# Patient Record
Sex: Female | Born: 1970 | Race: Black or African American | Hispanic: No | Smoking: Never smoker
Health system: Southern US, Community
[De-identification: ages and names within clinical notes are randomized; demographics above are authoritative.]

## PROBLEM LIST (undated history)

## (undated) DIAGNOSIS — R011 Cardiac murmur, unspecified: Secondary | ICD-10-CM

## (undated) DIAGNOSIS — R079 Chest pain, unspecified: Secondary | ICD-10-CM

## (undated) DIAGNOSIS — D649 Anemia, unspecified: Secondary | ICD-10-CM

## (undated) DIAGNOSIS — D869 Sarcoidosis, unspecified: Secondary | ICD-10-CM

## (undated) DIAGNOSIS — I10 Essential (primary) hypertension: Secondary | ICD-10-CM

## (undated) DIAGNOSIS — E010 Iodine-deficiency related diffuse (endemic) goiter: Secondary | ICD-10-CM

## (undated) DIAGNOSIS — K219 Gastro-esophageal reflux disease without esophagitis: Secondary | ICD-10-CM

## (undated) DIAGNOSIS — J45909 Unspecified asthma, uncomplicated: Secondary | ICD-10-CM

## (undated) HISTORY — DX: Anemia, unspecified: D64.9

## (undated) HISTORY — DX: Chest pain, unspecified: R07.9

## (undated) HISTORY — DX: Essential (primary) hypertension: I10

## (undated) HISTORY — DX: Cardiac murmur, unspecified: R01.1

## (undated) HISTORY — PX: HERNIA REPAIR: SHX51

## (undated) HISTORY — PX: BUNIONECTOMY: SHX129

## (undated) HISTORY — DX: Sarcoidosis, unspecified: D86.9

## (undated) HISTORY — DX: Iodine-deficiency related diffuse (endemic) goiter: E01.0

## (undated) HISTORY — DX: Gastro-esophageal reflux disease without esophagitis: K21.9

## (undated) HISTORY — DX: Unspecified asthma, uncomplicated: J45.909

## (undated) HISTORY — PX: BREAST BIOPSY: SHX20

---

## 1999-12-01 ENCOUNTER — Emergency Department (HOSPITAL_COMMUNITY): Admission: EM | Admit: 1999-12-01 | Discharge: 1999-12-01 | Payer: Self-pay | Admitting: Emergency Medicine

## 1999-12-01 ENCOUNTER — Encounter: Payer: Self-pay | Admitting: Emergency Medicine

## 2007-11-25 ENCOUNTER — Emergency Department (HOSPITAL_COMMUNITY): Admission: EM | Admit: 2007-11-25 | Discharge: 2007-11-25 | Payer: Self-pay | Admitting: *Deleted

## 2011-06-02 DIAGNOSIS — R079 Chest pain, unspecified: Secondary | ICD-10-CM | POA: Diagnosis not present

## 2011-06-02 DIAGNOSIS — J42 Unspecified chronic bronchitis: Secondary | ICD-10-CM | POA: Diagnosis not present

## 2011-06-02 DIAGNOSIS — K5900 Constipation, unspecified: Secondary | ICD-10-CM | POA: Diagnosis not present

## 2011-06-02 DIAGNOSIS — J209 Acute bronchitis, unspecified: Secondary | ICD-10-CM | POA: Diagnosis not present

## 2011-06-02 DIAGNOSIS — R109 Unspecified abdominal pain: Secondary | ICD-10-CM | POA: Diagnosis not present

## 2011-06-03 ENCOUNTER — Encounter: Payer: Self-pay | Admitting: Emergency Medicine

## 2011-06-03 ENCOUNTER — Emergency Department (HOSPITAL_COMMUNITY)
Admission: EM | Admit: 2011-06-03 | Discharge: 2011-06-04 | Disposition: A | Discharge status: Home / Self Care | Payer: Medicare Other | Attending: Emergency Medicine | Admitting: Emergency Medicine

## 2011-06-03 ENCOUNTER — Emergency Department (HOSPITAL_COMMUNITY): Payer: Medicare Other

## 2011-06-03 DIAGNOSIS — R079 Chest pain, unspecified: Secondary | ICD-10-CM | POA: Insufficient documentation

## 2011-06-03 DIAGNOSIS — M546 Pain in thoracic spine: Secondary | ICD-10-CM | POA: Insufficient documentation

## 2011-06-03 DIAGNOSIS — R110 Nausea: Secondary | ICD-10-CM | POA: Insufficient documentation

## 2011-06-03 DIAGNOSIS — J40 Bronchitis, not specified as acute or chronic: Secondary | ICD-10-CM | POA: Diagnosis not present

## 2011-06-03 DIAGNOSIS — R51 Headache: Secondary | ICD-10-CM | POA: Diagnosis not present

## 2011-06-03 DIAGNOSIS — R42 Dizziness and giddiness: Secondary | ICD-10-CM | POA: Insufficient documentation

## 2011-06-03 DIAGNOSIS — J42 Unspecified chronic bronchitis: Secondary | ICD-10-CM | POA: Insufficient documentation

## 2011-06-03 DIAGNOSIS — J209 Acute bronchitis, unspecified: Secondary | ICD-10-CM | POA: Diagnosis not present

## 2011-06-03 LAB — PREGNANCY, URINE: Preg Test, Ur: NEGATIVE

## 2011-06-03 MED ORDER — ONDANSETRON 8 MG PO TBDP: 8.0000 mg | ORAL_TABLET | Freq: Three times a day (TID) | ORAL | Status: DC | PRN

## 2011-06-03 MED ORDER — ONDANSETRON 8 MG PO TBDP: 8.0000 mg | ORAL_TABLET | Freq: Three times a day (TID) | ORAL | Status: AC | PRN

## 2011-06-03 MED ORDER — AZITHROMYCIN 250 MG PO TABS: 250.0000 mg | ORAL_TABLET | Freq: Every day | ORAL | Status: DC

## 2011-06-03 NOTE — ED Notes (Signed)
Pt sts HA with dizziness and nausea and mid back pain x 3 days; pt sts mid sternal CP starting yesterday

## 2011-06-03 NOTE — ED Provider Notes (Signed)
History     CSN: [id number]  Arrival date & time 06/03/11  1827   First MD Initiated Contact with Patient 06/03/11 2126      Chief Complaint  Patient presents with  . Chest Pain  . Headache  . Dizziness  . Nausea    (Consider location/radiation/quality/duration/timing/severity/associated sxs/prior treatment) HPI History provided by pt.   Pt has had constant nausea w/out vomiting, diffuse upper back pain and intermittent frontal headache and dizziness x 6 days.  Has had similar headaches in the past.  Has had chills but denies fever as well as cough, nasal congestion, sore throat, abd pain, diarrhea, urinary sx.  Is currently on menstrual cycle and cycles have been regular.  Yesterday she developed intermittent, non-exertional, non-radiating tightness and sharp pains in chest.  No associated SOB.  No immediate FH MI, does not smoke cigarettes and no PMH.  Pt evaluated for same symptoms at [facility] ED yesterday.   She reports that CXR and labs were normal.  She does not believe that they evaluated her heart though and would like to be checked for heart attack.    Past Medical History  Diagnosis Date  . Chronic bronchitis     History reviewed. No pertinent past surgical history.  History reviewed. No pertinent family history.  History  Substance Use Topics  . Smoking status: Never Smoker   . Smokeless tobacco: Not on file  . Alcohol Use: No    OB History    Grav Para Term Preterm Abortions TAB SAB Ect Mult Living                  Review of Systems  All other systems reviewed and are negative.    Allergies  Motrin  Home Medications   Current Outpatient Rx  Name Route Sig Dispense Refill  . ASPIRIN EC 81 MG PO TBEC Oral Take 81 mg by mouth 3 (three) times a week.      Marland Kitchen TIOTROPIUM BROMIDE MONOHYDRATE 18 MCG IN CAPS Inhalation Place 18 mcg into inhaler and inhale daily.        BP 135/93  Pulse 70  Temp(Src) 97.8 F (36.6 C) (Oral)  Resp 15  SpO2  100%  Physical Exam  Nursing note and vitals reviewed. Constitutional: She is oriented to person, place, and time. She appears well-developed and well-nourished. No distress.  HENT:  Head: Normocephalic and atraumatic.  Eyes:       Normal appearance  Neck: Normal range of motion.  Cardiovascular: Normal rate and regular rhythm.   Pulmonary/Chest: Effort normal and breath sounds normal. No respiratory distress. She exhibits no tenderness.  Abdominal: Soft. Bowel sounds are normal. She exhibits no distension. There is no tenderness. There is no guarding.  Musculoskeletal:       Entire neck and upper back non-tender.  No peripheral edema or calf ttp.    Neurological: She is alert and oriented to person, place, and time. No cranial nerve deficit. Coordination normal.       No sensory deficit.  5/5 and equal upper and lower extremity strength.  No past pointing.   Skin: Skin is warm and dry. No rash noted. She is not diaphoretic.  Psychiatric: She has a normal mood and affect. Her behavior is normal.    ED Course  Procedures (including critical care time)  Date: 06/04/2011  Rate: 67  Rhythm: normal sinus rhythm  QRS Axis: normal  Intervals: normal  ST/T Wave abnormalities: normal  Conduction Disutrbances:none  Narrative  Interpretation:   Old EKG Reviewed: none available    Labs Reviewed  PREGNANCY, URINE   Dg Chest 2 View  06/03/2011  *RADIOLOGY REPORT*  Clinical Data: Chest pain.  Chronic bronchitis.  CHEST - 2 VIEW  Comparison: 11/25/2007.  Findings:  Cardiopericardial silhouette within normal limits. Mediastinal contours normal. Trachea midline.  No airspace disease or effusion.  IMPRESSION: Negative two-view chest.  Original Report Authenticated By: Andreas Newport, M.D.     1. Bronchitis       MDM  Pt presents w/ multiple complaints, all of which were evaluated at [facility] ED yesterday, with exception of chest pain.  Pt concerned she may be having a heart attack.   Has h/o COPD for which she takes spiriva and albuterol but otherwise healthy, does not smoke and no immediate FH MI.  Pt reports that she has had similar sx in the past associated w/ bronchitis exacerbation (does not usually present w/ cough) and she would like a Zpak.  VS w/in nml limits, no respiratory distress, lungs CTA, abd benign and non-tender, no peripheral edema or calf ttp on exam.  EKG non-ischemic, CXR neg and urine preg (ordered d/t c/o daily nausea) neg.  All results discussed w/ pt.  This may be a mild exacerbation of patient's chronic bronchitis.  Explained risk vs. benefits of abx tmnt and pt is insistent.  D/c'd home w/ zpak and zofran, recommended f/u with her pulmonologist in [place], and referred to [facility].  Return precautions discussed.         Otilio Miu, PA 06/04/11 1455  Arie Sabina [place], [place] 06/04/11 1456

## 2011-06-04 NOTE — ED Notes (Signed)
Patient called requesting rx for Zithromax be changed Augmentin. Order d/c by mistake. Chart discussed with Levy Sjogren and she wants patient to take Zithromax as prescribed and follow f/u with PCP.

## 2011-06-06 NOTE — ED Provider Notes (Signed)
Medical screening examination/treatment/procedure(s) were performed by non-physician practitioner and as supervising physician I was immediately available for consultation/collaboration.   Elye Harmsen, MD 06/06/11 0156 

## 2011-06-11 DIAGNOSIS — R069 Unspecified abnormalities of breathing: Secondary | ICD-10-CM | POA: Diagnosis not present

## 2011-06-11 DIAGNOSIS — R000 Tachycardia, unspecified: Secondary | ICD-10-CM | POA: Diagnosis not present

## 2011-06-11 DIAGNOSIS — R079 Chest pain, unspecified: Secondary | ICD-10-CM | POA: Diagnosis not present

## 2011-06-11 DIAGNOSIS — R002 Palpitations: Secondary | ICD-10-CM | POA: Diagnosis not present

## 2011-06-19 DIAGNOSIS — I10 Essential (primary) hypertension: Secondary | ICD-10-CM | POA: Diagnosis not present

## 2011-06-19 DIAGNOSIS — R0602 Shortness of breath: Secondary | ICD-10-CM | POA: Diagnosis not present

## 2011-06-19 DIAGNOSIS — R079 Chest pain, unspecified: Secondary | ICD-10-CM | POA: Diagnosis not present

## 2011-06-19 DIAGNOSIS — R002 Palpitations: Secondary | ICD-10-CM | POA: Diagnosis not present

## 2011-06-24 DIAGNOSIS — R079 Chest pain, unspecified: Secondary | ICD-10-CM | POA: Diagnosis not present

## 2011-06-24 DIAGNOSIS — N63 Unspecified lump in unspecified breast: Secondary | ICD-10-CM | POA: Diagnosis not present

## 2011-06-25 DIAGNOSIS — N63 Unspecified lump in unspecified breast: Secondary | ICD-10-CM | POA: Diagnosis not present

## 2011-06-27 ENCOUNTER — Telehealth: Payer: Self-pay | Admitting: *Deleted

## 2011-06-27 NOTE — Telephone Encounter (Signed)
Confirmed 07/21/11 genetics appt w/ pt.  Called Jessica at [place] to make her aware.

## 2011-07-21 ENCOUNTER — Ambulatory Visit: Payer: Medicare Other

## 2011-07-21 NOTE — Progress Notes (Signed)
Patient seen for genetic counseling. Patient is low risk for a mutation. Did not recommend genetic testing.

## 2011-10-31 DIAGNOSIS — M546 Pain in thoracic spine: Secondary | ICD-10-CM | POA: Diagnosis not present

## 2011-10-31 DIAGNOSIS — R0789 Other chest pain: Secondary | ICD-10-CM | POA: Diagnosis not present

## 2011-10-31 DIAGNOSIS — I10 Essential (primary) hypertension: Secondary | ICD-10-CM | POA: Diagnosis not present

## 2011-10-31 DIAGNOSIS — R079 Chest pain, unspecified: Secondary | ICD-10-CM | POA: Diagnosis not present

## 2011-11-07 DIAGNOSIS — J45909 Unspecified asthma, uncomplicated: Secondary | ICD-10-CM | POA: Diagnosis not present

## 2011-11-07 DIAGNOSIS — R946 Abnormal results of thyroid function studies: Secondary | ICD-10-CM | POA: Diagnosis not present

## 2011-11-07 DIAGNOSIS — E049 Nontoxic goiter, unspecified: Secondary | ICD-10-CM | POA: Diagnosis not present

## 2011-11-07 DIAGNOSIS — R079 Chest pain, unspecified: Secondary | ICD-10-CM | POA: Diagnosis not present

## 2011-11-07 DIAGNOSIS — R109 Unspecified abdominal pain: Secondary | ICD-10-CM | POA: Diagnosis not present

## 2011-11-07 DIAGNOSIS — J309 Allergic rhinitis, unspecified: Secondary | ICD-10-CM | POA: Diagnosis not present

## 2011-11-07 DIAGNOSIS — D649 Anemia, unspecified: Secondary | ICD-10-CM | POA: Diagnosis not present

## 2011-11-07 DIAGNOSIS — J329 Chronic sinusitis, unspecified: Secondary | ICD-10-CM | POA: Diagnosis not present

## 2011-11-07 DIAGNOSIS — R002 Palpitations: Secondary | ICD-10-CM | POA: Diagnosis not present

## 2011-11-07 DIAGNOSIS — N644 Mastodynia: Secondary | ICD-10-CM | POA: Diagnosis not present

## 2011-11-07 DIAGNOSIS — D869 Sarcoidosis, unspecified: Secondary | ICD-10-CM | POA: Diagnosis not present

## 2011-11-07 DIAGNOSIS — J3081 Allergic rhinitis due to animal (cat) (dog) hair and dander: Secondary | ICD-10-CM | POA: Diagnosis not present

## 2011-11-07 DIAGNOSIS — R0989 Other specified symptoms and signs involving the circulatory and respiratory systems: Secondary | ICD-10-CM | POA: Diagnosis not present

## 2011-11-07 DIAGNOSIS — J3089 Other allergic rhinitis: Secondary | ICD-10-CM | POA: Diagnosis not present

## 2011-11-07 DIAGNOSIS — M549 Dorsalgia, unspecified: Secondary | ICD-10-CM | POA: Diagnosis not present

## 2011-11-07 DIAGNOSIS — I10 Essential (primary) hypertension: Secondary | ICD-10-CM | POA: Diagnosis not present

## 2011-11-10 DIAGNOSIS — R0989 Other specified symptoms and signs involving the circulatory and respiratory systems: Secondary | ICD-10-CM | POA: Diagnosis not present

## 2011-11-10 DIAGNOSIS — R079 Chest pain, unspecified: Secondary | ICD-10-CM | POA: Diagnosis not present

## 2011-11-10 DIAGNOSIS — K3189 Other diseases of stomach and duodenum: Secondary | ICD-10-CM | POA: Diagnosis not present

## 2011-11-10 DIAGNOSIS — I10 Essential (primary) hypertension: Secondary | ICD-10-CM | POA: Diagnosis not present

## 2011-11-12 DIAGNOSIS — R002 Palpitations: Secondary | ICD-10-CM | POA: Diagnosis not present

## 2011-11-12 DIAGNOSIS — R079 Chest pain, unspecified: Secondary | ICD-10-CM | POA: Diagnosis not present

## 2011-11-12 DIAGNOSIS — R0989 Other specified symptoms and signs involving the circulatory and respiratory systems: Secondary | ICD-10-CM | POA: Diagnosis not present

## 2011-11-21 DIAGNOSIS — R0609 Other forms of dyspnea: Secondary | ICD-10-CM | POA: Diagnosis not present

## 2011-11-21 DIAGNOSIS — D869 Sarcoidosis, unspecified: Secondary | ICD-10-CM | POA: Diagnosis not present

## 2011-11-21 DIAGNOSIS — I10 Essential (primary) hypertension: Secondary | ICD-10-CM | POA: Diagnosis not present

## 2011-11-21 DIAGNOSIS — R0989 Other specified symptoms and signs involving the circulatory and respiratory systems: Secondary | ICD-10-CM | POA: Diagnosis not present

## 2011-11-21 DIAGNOSIS — K3189 Other diseases of stomach and duodenum: Secondary | ICD-10-CM | POA: Diagnosis not present

## 2011-11-21 DIAGNOSIS — M549 Dorsalgia, unspecified: Secondary | ICD-10-CM | POA: Diagnosis not present

## 2011-11-21 DIAGNOSIS — R079 Chest pain, unspecified: Secondary | ICD-10-CM | POA: Diagnosis not present

## 2011-11-21 DIAGNOSIS — R109 Unspecified abdominal pain: Secondary | ICD-10-CM | POA: Diagnosis not present

## 2011-12-02 DIAGNOSIS — J45909 Unspecified asthma, uncomplicated: Secondary | ICD-10-CM | POA: Diagnosis not present

## 2011-12-02 DIAGNOSIS — D869 Sarcoidosis, unspecified: Secondary | ICD-10-CM | POA: Diagnosis not present

## 2011-12-02 DIAGNOSIS — R0602 Shortness of breath: Secondary | ICD-10-CM | POA: Diagnosis not present

## 2011-12-05 DIAGNOSIS — M549 Dorsalgia, unspecified: Secondary | ICD-10-CM | POA: Diagnosis not present

## 2011-12-05 DIAGNOSIS — E049 Nontoxic goiter, unspecified: Secondary | ICD-10-CM | POA: Diagnosis not present

## 2011-12-05 DIAGNOSIS — I10 Essential (primary) hypertension: Secondary | ICD-10-CM | POA: Diagnosis not present

## 2011-12-05 DIAGNOSIS — D869 Sarcoidosis, unspecified: Secondary | ICD-10-CM | POA: Diagnosis not present

## 2011-12-05 DIAGNOSIS — J45909 Unspecified asthma, uncomplicated: Secondary | ICD-10-CM | POA: Diagnosis not present

## 2011-12-05 DIAGNOSIS — J329 Chronic sinusitis, unspecified: Secondary | ICD-10-CM | POA: Diagnosis not present

## 2011-12-18 DIAGNOSIS — J45909 Unspecified asthma, uncomplicated: Secondary | ICD-10-CM | POA: Diagnosis not present

## 2011-12-18 DIAGNOSIS — D869 Sarcoidosis, unspecified: Secondary | ICD-10-CM | POA: Diagnosis not present

## 2011-12-18 DIAGNOSIS — R0602 Shortness of breath: Secondary | ICD-10-CM | POA: Diagnosis not present

## 2011-12-18 DIAGNOSIS — R0982 Postnasal drip: Secondary | ICD-10-CM | POA: Diagnosis not present

## 2011-12-18 LAB — PULMONARY FUNCTION TEST

## 2012-02-24 ENCOUNTER — Emergency Department (HOSPITAL_COMMUNITY)
Admission: EM | Admit: 2012-02-24 | Discharge: 2012-02-25 | Disposition: A | Discharge status: Home / Self Care | Payer: Medicare Other | Attending: Emergency Medicine | Admitting: Emergency Medicine

## 2012-02-24 ENCOUNTER — Emergency Department (HOSPITAL_COMMUNITY): Payer: Medicare Other

## 2012-02-24 ENCOUNTER — Encounter (HOSPITAL_COMMUNITY): Payer: Self-pay | Admitting: Emergency Medicine

## 2012-02-24 DIAGNOSIS — R079 Chest pain, unspecified: Secondary | ICD-10-CM | POA: Insufficient documentation

## 2012-02-24 DIAGNOSIS — J40 Bronchitis, not specified as acute or chronic: Secondary | ICD-10-CM | POA: Insufficient documentation

## 2012-02-24 DIAGNOSIS — R072 Precordial pain: Secondary | ICD-10-CM | POA: Diagnosis not present

## 2012-02-24 DIAGNOSIS — Z7982 Long term (current) use of aspirin: Secondary | ICD-10-CM | POA: Insufficient documentation

## 2012-02-24 LAB — BASIC METABOLIC PANEL
BUN: 8 mg/dL (ref 6–23)
CO2: 23 mEq/L (ref 19–32)
Calcium: 9.3 mg/dL (ref 8.4–10.5)
Chloride: 101 mEq/L (ref 96–112)
Creatinine, Ser: 0.75 mg/dL (ref 0.50–1.10)
GFR calc Af Amer: >90 mL/min (ref >90)
GFR calc non Af Amer: >90 mL/min (ref >90)
Glucose, Bld: 88 mg/dL (ref 70–99)
Potassium: 3.7 mEq/L (ref 3.5–5.1)
Sodium: 135 mEq/L (ref 135–145)

## 2012-02-24 LAB — CBC
HCT: 31.7 % — ABNORMAL LOW (ref 36.0–46.0)
Hemoglobin: 11 g/dL — ABNORMAL LOW (ref 12.0–15.0)
MCH: 30.1 pg (ref 26.0–34.0)
MCHC: 34.7 g/dL (ref 30.0–36.0)
MCV: 86.8 fL (ref 78.0–100.0)
Platelets: 396 10*3/uL (ref 150–400)
RBC: 3.65 MIL/uL — ABNORMAL LOW (ref 3.87–5.11)
RDW: 12.4 % (ref 11.5–15.5)
WBC: 6.4 10*3/uL (ref 4.0–10.5)

## 2012-02-24 LAB — HEPATIC FUNCTION PANEL
ALT: 8 U/L (ref 0–35)
AST: 14 U/L (ref 0–37)
Albumin: 3.7 g/dL (ref 3.5–5.2)
Alkaline Phosphatase: 45 U/L (ref 39–117)
Bilirubin, Direct: <0.1 mg/dL (ref 0.0–0.3)
Total Bilirubin: 0.2 mg/dL — ABNORMAL LOW (ref 0.3–1.2)
Total Protein: 7.1 g/dL (ref 6.0–8.3)

## 2012-02-24 LAB — LIPASE, BLOOD: Lipase: 29 U/L (ref 11–59)

## 2012-02-24 LAB — TROPONIN I: Troponin I: <0.3 ng/mL (ref <0.30)

## 2012-02-24 MED ORDER — PANTOPRAZOLE SODIUM 40 MG PO TBEC: 40.0000 mg | DELAYED_RELEASE_TABLET | Freq: Every day | ORAL | Status: DC

## 2012-02-24 NOTE — ED Provider Notes (Signed)
History     CSN: [id number]  Arrival date & time 02/24/12  2041   First MD Initiated Contact with Patient 02/24/12 2142      Chief Complaint  Patient presents with  . Chest Pain    (Consider location/radiation/quality/duration/timing/severity/associated sxs/prior treatment) Patient is a 41 y.o. female presenting with chest pain. The history is provided by the patient.  Chest Pain Pertinent negatives for primary symptoms include no fever, no shortness of breath, no cough, no palpitations, no abdominal pain, no nausea and no vomiting.   pt c/o mid to lower sternal/midline cp for past day. At rest. No change w activity or exertion. Not relating to eating. No positional change. Constant. States has been burping/gassy, hx gerd. Denies hx gallstones.  Denies hx cad or family hx premature cad. States has had neg ed eval and neg stress test for similar pain in past. Pain is not pleuritic. No leg pain or swelling. No immobility, trauma or surgery, non smoker. No ocp use. No hx dvt or pe. Denies any associated nv, diaphoresis or sob. No unusual fatigue or doe. No radiation. No cough or uri c/o. No fever or chills. No chest wall injury or strain.      Past Medical History  Diagnosis Date  . Chronic bronchitis     History reviewed. No pertinent past surgical history.  No family history on file.  History  Substance Use Topics  . Smoking status: Never Smoker   . Smokeless tobacco: Not on file  . Alcohol Use: No    OB History    Grav Para Term Preterm Abortions TAB SAB Ect Mult Living                  Review of Systems  Constitutional: Negative for fever and chills.  HENT: Negative for neck pain.   Eyes: Negative for redness.  Respiratory: Negative for cough and shortness of breath.   Cardiovascular: Positive for chest pain. Negative for palpitations and leg swelling.  Gastrointestinal: Negative for nausea, vomiting and abdominal pain.  Genitourinary: Negative for flank pain.    Musculoskeletal: Negative for back pain.  Skin: Negative for rash.  Neurological: Negative for headaches.  Hematological: Does not bruise/bleed easily.  Psychiatric/Behavioral: Negative for confusion.    Allergies  Motrin and Peanut-containing drug products  Home Medications   Current Outpatient Rx  Name Route Sig Dispense Refill  . ALBUTEROL SULFATE HFA 108 (90 BASE) MCG/ACT IN AERS Inhalation Inhale 2 puffs into the lungs every 6 (six) hours as needed. For wheezing or shortness of breath    . ASPIRIN EC 81 MG PO TBEC Oral Take 81 mg by mouth 3 (three) times a week.        BP 131/79  Pulse 81  Temp 98 F (36.7 C)  Resp 16  Wt 110 lb (49.896 kg)  SpO2 100%  LMP 02/12/2012  Physical Exam  Nursing note and vitals reviewed. Constitutional: She appears well-developed and well-nourished. No distress.  HENT:  Nose: Nose normal.  Eyes: Conjunctivae normal are normal. No scleral icterus.  Neck: Neck supple. No tracheal deviation present.  Cardiovascular: Normal rate, regular rhythm, normal heart sounds and intact distal pulses.  Exam reveals no gallop and no friction rub.   No murmur heard. Pulmonary/Chest: Effort normal and breath sounds normal. No respiratory distress. She exhibits no tenderness.  Abdominal: Soft. Normal appearance. She exhibits no distension. There is no tenderness.  Musculoskeletal: She exhibits no edema and no tenderness.  Neurological: She is  alert.  Skin: Skin is warm and dry. No rash noted.  Psychiatric: She has a normal mood and affect.    ED Course  Procedures (including critical care time)   Labs Reviewed  CBC  BASIC METABOLIC PANEL  TROPONIN I  HEPATIC FUNCTION PANEL  LIPASE, BLOOD   Results for orders placed during the hospital encounter of 02/24/12  CBC      Component Value Range   WBC 6.4  4.0 - 10.5 K/uL   RBC 3.65 (*) 3.87 - 5.11 MIL/uL   Hemoglobin 11.0 (*) 12.0 - 15.0 g/dL   HCT 84.1 (*) 66.0 - 63.0 %   MCV 86.8  78.0 - 100.0  fL   MCH 30.1  26.0 - 34.0 pg   MCHC 34.7  30.0 - 36.0 g/dL   RDW 16.0  10.9 - 32.3 %   Platelets 396  150 - 400 K/uL  BASIC METABOLIC PANEL      Component Value Range   Sodium 135  135 - 145 mEq/L   Potassium 3.7  3.5 - 5.1 mEq/L   Chloride 101  96 - 112 mEq/L   CO2 23  19 - 32 mEq/L   Glucose, Bld 88  70 - 99 mg/dL   BUN 8  6 - 23 mg/dL   Creatinine, Ser 5.57  0.50 - 1.10 mg/dL   Calcium 9.3  8.4 - 32.2 mg/dL   GFR calc non Af Amer >90  >90 mL/min   GFR calc Af Amer >90  >90 mL/min  TROPONIN I      Component Value Range   Troponin I <0.30  <0.30 ng/mL  HEPATIC FUNCTION PANEL      Component Value Range   Total Protein 7.1  6.0 - 8.3 g/dL   Albumin 3.7  3.5 - 5.2 g/dL   AST 14  0 - 37 U/L   ALT 8  0 - 35 U/L   Alkaline Phosphatase 45  39 - 117 U/L   Total Bilirubin 0.2 (*) 0.3 - 1.2 mg/dL   Bilirubin, Direct <0.2  0.0 - 0.3 mg/dL   Indirect Bilirubin NOT CALCULATED  0.3 - 0.9 mg/dL  LIPASE, BLOOD      Component Value Range   Lipase 29  11 - 59 U/L   Dg Chest 2 View  02/24/2012  *RADIOLOGY REPORT*  Clinical Data: Chest pain  CHEST - 2 VIEW  Comparison: 06/03/2011  Findings: Lungs are clear. No pleural effusion or pneumothorax. The cardiomediastinal contours are within normal limits. The visualized bones and soft tissues are without significant appreciable abnormality.  IMPRESSION: No radiographic evidence of acute cardiopulmonary process.   Original Report Authenticated By: Waneta Martins, M.D.        MDM  Ecg. Labs. Cxr.   Reviewed nursing notes and prior charts for additional history.     Date: 02/24/2012  Rate: 68  Rhythm: normal sinus rhythm  QRS Axis: normal  Intervals: normal  ST/T Wave abnormalities: normal  Conduction Disutrbances:none  Narrative Interpretation:   Old EKG Reviewed: unchanged  Symptoms present for 24+ hours, trop neg.   Pt reports normal stress test in past.   Pt  Appears stable for d/c, suspect likely gi etiology to symptoms.           Suzi Roots, MD 02/24/12 [phone]

## 2012-02-24 NOTE — ED Notes (Signed)
Pt alert, arrives from home, c/o mid sternal chest pain, onset today, hx of same, treated for reflux, states prescribed medications no relief, states took ASA pta, resp even unlabored, skin pwd, states pain radiates to back

## 2012-03-02 DIAGNOSIS — S01502A Unspecified open wound of oral cavity, initial encounter: Secondary | ICD-10-CM | POA: Diagnosis not present

## 2012-03-11 DIAGNOSIS — I10 Essential (primary) hypertension: Secondary | ICD-10-CM | POA: Diagnosis not present

## 2012-03-11 DIAGNOSIS — N644 Mastodynia: Secondary | ICD-10-CM | POA: Diagnosis not present

## 2012-03-11 DIAGNOSIS — J329 Chronic sinusitis, unspecified: Secondary | ICD-10-CM | POA: Diagnosis not present

## 2012-03-11 DIAGNOSIS — E049 Nontoxic goiter, unspecified: Secondary | ICD-10-CM | POA: Diagnosis not present

## 2012-03-11 DIAGNOSIS — D869 Sarcoidosis, unspecified: Secondary | ICD-10-CM | POA: Diagnosis not present

## 2012-03-11 DIAGNOSIS — R946 Abnormal results of thyroid function studies: Secondary | ICD-10-CM | POA: Diagnosis not present

## 2012-03-11 DIAGNOSIS — M549 Dorsalgia, unspecified: Secondary | ICD-10-CM | POA: Diagnosis not present

## 2012-03-11 DIAGNOSIS — J45909 Unspecified asthma, uncomplicated: Secondary | ICD-10-CM | POA: Diagnosis not present

## 2012-03-18 DIAGNOSIS — J309 Allergic rhinitis, unspecified: Secondary | ICD-10-CM | POA: Diagnosis not present

## 2012-03-19 DIAGNOSIS — R0982 Postnasal drip: Secondary | ICD-10-CM | POA: Diagnosis not present

## 2012-03-19 DIAGNOSIS — D869 Sarcoidosis, unspecified: Secondary | ICD-10-CM | POA: Diagnosis not present

## 2012-03-19 DIAGNOSIS — J45909 Unspecified asthma, uncomplicated: Secondary | ICD-10-CM | POA: Diagnosis not present

## 2012-03-19 DIAGNOSIS — R0602 Shortness of breath: Secondary | ICD-10-CM | POA: Diagnosis not present

## 2012-03-23 DIAGNOSIS — J3089 Other allergic rhinitis: Secondary | ICD-10-CM | POA: Diagnosis not present

## 2012-03-23 DIAGNOSIS — J301 Allergic rhinitis due to pollen: Secondary | ICD-10-CM | POA: Diagnosis not present

## 2012-03-23 DIAGNOSIS — J3081 Allergic rhinitis due to animal (cat) (dog) hair and dander: Secondary | ICD-10-CM | POA: Diagnosis not present

## 2012-03-23 DIAGNOSIS — N6009 Solitary cyst of unspecified breast: Secondary | ICD-10-CM | POA: Diagnosis not present

## 2012-03-25 DIAGNOSIS — J45909 Unspecified asthma, uncomplicated: Secondary | ICD-10-CM | POA: Diagnosis not present

## 2012-03-25 DIAGNOSIS — E049 Nontoxic goiter, unspecified: Secondary | ICD-10-CM | POA: Diagnosis not present

## 2012-03-25 DIAGNOSIS — D869 Sarcoidosis, unspecified: Secondary | ICD-10-CM | POA: Diagnosis not present

## 2012-03-25 DIAGNOSIS — R079 Chest pain, unspecified: Secondary | ICD-10-CM | POA: Diagnosis not present

## 2012-03-25 DIAGNOSIS — E559 Vitamin D deficiency, unspecified: Secondary | ICD-10-CM | POA: Diagnosis not present

## 2012-03-25 DIAGNOSIS — M549 Dorsalgia, unspecified: Secondary | ICD-10-CM | POA: Diagnosis not present

## 2012-03-25 DIAGNOSIS — R0989 Other specified symptoms and signs involving the circulatory and respiratory systems: Secondary | ICD-10-CM | POA: Diagnosis not present

## 2012-03-25 DIAGNOSIS — I10 Essential (primary) hypertension: Secondary | ICD-10-CM | POA: Diagnosis not present

## 2012-04-20 DIAGNOSIS — E049 Nontoxic goiter, unspecified: Secondary | ICD-10-CM | POA: Diagnosis not present

## 2012-04-20 DIAGNOSIS — J45909 Unspecified asthma, uncomplicated: Secondary | ICD-10-CM | POA: Diagnosis not present

## 2012-04-20 DIAGNOSIS — R946 Abnormal results of thyroid function studies: Secondary | ICD-10-CM | POA: Diagnosis not present

## 2012-04-20 DIAGNOSIS — D869 Sarcoidosis, unspecified: Secondary | ICD-10-CM | POA: Diagnosis not present

## 2012-04-20 DIAGNOSIS — E559 Vitamin D deficiency, unspecified: Secondary | ICD-10-CM | POA: Diagnosis not present

## 2012-04-20 DIAGNOSIS — J329 Chronic sinusitis, unspecified: Secondary | ICD-10-CM | POA: Diagnosis not present

## 2012-04-20 DIAGNOSIS — I10 Essential (primary) hypertension: Secondary | ICD-10-CM | POA: Diagnosis not present

## 2012-04-20 DIAGNOSIS — M549 Dorsalgia, unspecified: Secondary | ICD-10-CM | POA: Diagnosis not present

## 2012-04-21 DIAGNOSIS — J3089 Other allergic rhinitis: Secondary | ICD-10-CM | POA: Diagnosis not present

## 2012-04-21 DIAGNOSIS — J3081 Allergic rhinitis due to animal (cat) (dog) hair and dander: Secondary | ICD-10-CM | POA: Diagnosis not present

## 2012-04-21 DIAGNOSIS — J301 Allergic rhinitis due to pollen: Secondary | ICD-10-CM | POA: Diagnosis not present

## 2012-05-13 ENCOUNTER — Ambulatory Visit (INDEPENDENT_AMBULATORY_CARE_PROVIDER_SITE_OTHER): Payer: Medicare Other | Admitting: Internal Medicine

## 2012-05-13 ENCOUNTER — Encounter: Payer: Self-pay | Admitting: Internal Medicine

## 2012-05-13 VITALS — BP 127/80 | HR 80 | Ht 61.0 in | Wt 109.0 lb

## 2012-05-13 DIAGNOSIS — R079 Chest pain, unspecified: Secondary | ICD-10-CM

## 2012-05-13 DIAGNOSIS — R0602 Shortness of breath: Secondary | ICD-10-CM | POA: Diagnosis not present

## 2012-05-13 LAB — LIPID PANEL
Cholesterol: 159 mg/dL (ref 0–200)
HDL: 73.5 mg/dL (ref >39.00)
LDL Cholesterol: 76 mg/dL (ref 0–99)
Total CHOL/HDL Ratio: 2
Triglycerides: 49 mg/dL (ref 0.0–149.0)
VLDL: 9.8 mg/dL (ref 0.0–40.0)

## 2012-05-13 NOTE — Patient Instructions (Addendum)
Your physician has requested that you have an echocardiogram. Echocardiography is a painless test that uses sound waves to create images of your heart. It provides your doctor with information about the size and shape of your heart and how well your heart's chambers and valves are working. This procedure takes approximately one hour. There are no restrictions for this procedure.  Lab work today We will call you with results. 

## 2012-05-13 NOTE — Progress Notes (Signed)
HPI Patient is a 41 yo with a history of Sarcoid Followed by Rebecca Sanchez in Pulmonary Also followed by Dr Rebecca Sanchez Had PFTs as wwll as walk test  O2 does go down with walking  Had chest CT With exertion and without exertion she gets chest pains.  Gets nauseated, dizzy  Pain in upper back  SweatyThey are sharp pains.  Ripping pain.  Can last 10 min. No pleuritic, not positional When exercising (walking) gets Short winded with rippling pains Spells happen at least 1x per wk.  Can wake up with it. This all started about 1 year ago.  SOB is getting worrisome  If gets up too fast can get dizzy. No syncope  Allergies  Allergen Reactions  . Motrin (Ibuprofen) Shortness Of Breath  . Peanut-Containing Drug Products Anaphylaxis    Current Outpatient Prescriptions  Medication Sig Dispense Refill  . albuterol (PROAIR HFA) 108 (90 BASE) MCG/ACT inhaler Inhale 2 puffs into the lungs every 6 (six) hours as needed.      Marland Kitchen aspirin 81 MG tablet Take 81 mg by mouth as needed.       Marland Kitchen VITAMIN D, CHOLECALCIFEROL, PO Take 1 tablet by mouth daily.        Past Medical History  Diagnosis Date  . Chronic bronchitis     No past surgical history on file.  No family history on file.  History   Social History  . Marital Status: Single    Spouse Name: N/A    Number of Children: N/A  . Years of Education: N/A   Occupational History  . Not on file.   Social History Main Topics  . Smoking status: Never Smoker   . Smokeless tobacco: Not on file  . Alcohol Use: No  . Drug Use: No  . Sexually Active:    Other Topics Concern  . Not on file   Social History Narrative  . No narrative on file    Review of Systems:  All systems reviewed.  They are negative to the above problem except as previously stated.  Vital Signs: BP 127/80  Pulse 80  Ht 5\' 1"  (1.549 m)  Wt 109 lb (49.442 kg)  BMI 20.60 kg/m2  Physical Exam Patient is in NAD  O2 sat with walking 96% HEENT:  Normocephalic,  atraumatic. EOMI, PERRLA.  Neck: JVP is normal.  No bruits.  Lungs: clear to auscultation. No rales no wheezes.  Heart: Regular rate and rhythm. Normal S1, S2. No S3.   No significant murmurs. PMI not displaced.  Abdomen:  Supple, nontender. Normal bowel sounds. No masses. No hepatomegaly.  Extremities:   Good distal pulses throughout. No lower extremity edema.  Musculoskeletal :moving all extremities.  Neuro:   alert and oriented x3.  CN II-XII grossly intact.  EKG  SR 76 bpm.   Assessment and Plan:  1.  CP  Pain is very atypical for cardiac.  SHe did not get hypoxic today with walking .  Will review outside tests  Would also set up for an echo to evaluate LV and RV function; right sided pressures  2.  Sarcoid  Need to review outside records.  Echo

## 2012-05-14 DIAGNOSIS — J3089 Other allergic rhinitis: Secondary | ICD-10-CM | POA: Diagnosis not present

## 2012-05-14 DIAGNOSIS — J301 Allergic rhinitis due to pollen: Secondary | ICD-10-CM | POA: Diagnosis not present

## 2012-05-14 DIAGNOSIS — J3081 Allergic rhinitis due to animal (cat) (dog) hair and dander: Secondary | ICD-10-CM | POA: Diagnosis not present

## 2012-05-21 ENCOUNTER — Ambulatory Visit (HOSPITAL_COMMUNITY): Payer: Medicare Other | Attending: Cardiology

## 2012-05-21 DIAGNOSIS — I059 Rheumatic mitral valve disease, unspecified: Secondary | ICD-10-CM | POA: Insufficient documentation

## 2012-05-21 DIAGNOSIS — R072 Precordial pain: Secondary | ICD-10-CM | POA: Diagnosis not present

## 2012-05-21 DIAGNOSIS — R0602 Shortness of breath: Secondary | ICD-10-CM

## 2012-05-21 DIAGNOSIS — I369 Nonrheumatic tricuspid valve disorder, unspecified: Secondary | ICD-10-CM | POA: Diagnosis not present

## 2012-05-21 DIAGNOSIS — R079 Chest pain, unspecified: Secondary | ICD-10-CM

## 2012-05-21 NOTE — Progress Notes (Signed)
Echocardiogram performed.  

## 2012-06-13 DIAGNOSIS — R0789 Other chest pain: Secondary | ICD-10-CM | POA: Diagnosis not present

## 2012-06-13 DIAGNOSIS — Z79899 Other long term (current) drug therapy: Secondary | ICD-10-CM | POA: Diagnosis not present

## 2012-06-13 DIAGNOSIS — Z888 Allergy status to other drugs, medicaments and biological substances status: Secondary | ICD-10-CM | POA: Diagnosis not present

## 2012-06-13 DIAGNOSIS — R079 Chest pain, unspecified: Secondary | ICD-10-CM | POA: Diagnosis not present

## 2012-06-17 ENCOUNTER — Encounter: Payer: Self-pay | Admitting: Nurse Practitioner

## 2012-06-17 ENCOUNTER — Ambulatory Visit (INDEPENDENT_AMBULATORY_CARE_PROVIDER_SITE_OTHER): Payer: Medicare Other | Admitting: Nurse Practitioner

## 2012-06-17 ENCOUNTER — Telehealth: Payer: Self-pay | Admitting: Internal Medicine

## 2012-06-17 VITALS — BP 132/98 | HR 76 | Ht 63.0 in | Wt 110.2 lb

## 2012-06-17 DIAGNOSIS — R079 Chest pain, unspecified: Secondary | ICD-10-CM | POA: Insufficient documentation

## 2012-06-17 DIAGNOSIS — D869 Sarcoidosis, unspecified: Secondary | ICD-10-CM | POA: Diagnosis not present

## 2012-06-17 NOTE — Telephone Encounter (Signed)
Release Faxed To [facility] @ [phone] 06/17/12/KM

## 2012-06-17 NOTE — Progress Notes (Signed)
Patient Name: Rebecca Sanchez Date of Encounter: 06/17/2012  Primary Care Provider:  No primary provider on file. Primary Cardiologist:  Lovina Reach, MD  Patient Profile  42 y/o female with h/o sarcoid and chest pain who presents for f/u.  Problem List   Past Medical History  Diagnosis Date  . Chronic bronchitis   . Sarcoid   . HTN (hypertension)   . Asthma   . Thyromegaly   . Chest pain     a. 04/2012 Echo: EF 65%, nl wall motion.   No past surgical history on file.  Allergies  Allergies  Allergen Reactions  . Motrin (Ibuprofen) Shortness Of Breath  . Peanut-Containing Drug Products Anaphylaxis   HPI  42 year old female who reports a long history of sharp, fleeting, "lightening bolt"-like chest pain often associated with diaphoresis.  She says this has been going on for years and has previously been treated with antacids without much change in symptoms.  She also reports that one point she was taking nebivolol for both these pains as well as palpitations but has since come off of this.  Her discomfort occurs almost every day and lasts up to about 15 minutes and resolving spontaneously.  She was seen in clinic by Dr. Tenny Craw in December and underwent echocardiogram which showed normal LV function.  Unfortunately her symptoms have persisted and she is back today.  She denies palpitations, dyspnea, pnd, orthopnea, n, v, dizziness, syncope, edema, weight gain, or early satiety. Pain is not any worse with palpation, deep breathing, or position changes.  Home Medications  Prior to Admission medications   Medication Sig Start Date End Date Taking? Authorizing Provider  albuterol (PROAIR HFA) 108 (90 BASE) MCG/ACT inhaler Inhale 2 puffs into the lungs every 6 (six) hours as needed.   Yes Historical Provider, MD  aspirin 81 MG tablet Take 81 mg by mouth as needed.    Yes Historical Provider, MD  VITAMIN D, CHOLECALCIFEROL, PO Take 1 tablet by mouth daily.   Yes Historical Provider, MD     Review of Systems  Sharp, fleeting chest pain as outlined above.  All other systems reviewed and are otherwise negative except as noted above.  Physical Exam  Blood pressure 132/98, pulse 76, height 5\' 3"  (1.6 m), weight 110 lb 3.2 oz (49.986 kg).  General: Pleasant, NAD Psych: Normal affect. Neuro: Alert and oriented X 3. Moves all extremities spontaneously. HEENT: Normal  Neck: Supple without bruits or JVD. Lungs:  Resp regular and unlabored, CTA. Heart: RRR no s3, s4, or murmurs. Abdomen: Soft, non-tender, non-distended, BS + x 4.  Extremities: No clubbing, cyanosis or edema. DP/PT/Radials 2+ and equal bilaterally.  Accessory Clinical Findings  ECG - RSR, 76, no acute st/t changes.  Assessment & Plan  1.  Chest pain:  Pt presents with a long h/o fleeting, sharp/shooting chest pain.  This is now occurring daily with rest or exertion and is often associated with nausea and diaphoresis. She had nl LV fxn w/o wall motion abnormalities on recent echo.  I have ordered an ETT to evaluate her atypical symptoms and r/o ischemia.  She was clear with me that she felt as though I wasn't taking her Ss seriously and wished that I would prescribe her "heart medicine" for her symptoms.  I advised that at this point it is not clear that her symptoms are coming from her heart and that the stress test will help to clarify that.  She is willing to proceed but still was  not happy with my assessment of her pain.  Nicolasa Ducking, NP 06/17/2012, 12:09 PM

## 2012-06-17 NOTE — Patient Instructions (Signed)
Your physician has requested that you have an exercise tolerance test. For further information please visit [email or web address] Please also follow instruction sheet, as given. ---with Dr Tenny Craw

## 2012-06-21 ENCOUNTER — Telehealth: Payer: Self-pay | Admitting: Internal Medicine

## 2012-06-21 NOTE — Telephone Encounter (Signed)
Records records From [facility], gave to [facility] 06/21/12/KM

## 2012-06-23 DIAGNOSIS — J3081 Allergic rhinitis due to animal (cat) (dog) hair and dander: Secondary | ICD-10-CM | POA: Diagnosis not present

## 2012-06-23 DIAGNOSIS — J301 Allergic rhinitis due to pollen: Secondary | ICD-10-CM | POA: Diagnosis not present

## 2012-06-23 DIAGNOSIS — J3089 Other allergic rhinitis: Secondary | ICD-10-CM | POA: Diagnosis not present

## 2012-06-24 ENCOUNTER — Encounter: Payer: Medicare Other | Admitting: Internal Medicine

## 2012-07-19 ENCOUNTER — Encounter (INDEPENDENT_AMBULATORY_CARE_PROVIDER_SITE_OTHER): Payer: Medicare Other | Admitting: Internal Medicine

## 2012-07-19 DIAGNOSIS — R079 Chest pain, unspecified: Secondary | ICD-10-CM | POA: Diagnosis not present

## 2012-07-19 NOTE — Procedures (Signed)
Exercise Treadmill Test  Pre-Exercise Testing Evaluation Rhythm: normal sinus  Rate: 96     Test  Exercise Tolerance Test Ordering MD: Dietrich Pates, MD  Interpreting MD: Dietrich Pates, MD  Unique Test No: 1 Treadmill:  1  Indication for ETT: shortness of breath  Contraindication to ETT: No   Stress Modality: exercise - treadmill  Cardiac Imaging Performed: non   Protocol: standard Bruce - maximal  Max BP:  168/68  Max MPHR (bpm):  177 85% MPR (bpm):  150  MPHR obtained (bpm):  153 % MPHR obtained:  86  Reached 85% MPHR (min:sec): 6.26 Total Exercise Time (min-sec):  6:35  Workload in METS:7.9 Borg Scale: unk  Reason ETT Terminated:  desired heart rate attained    ST Segment Analysis At Rest: normal ST segments - no evidence of significant ST depression With Exercise: no evidence of significant ST depression  Other Information Arrhythmia:  No Angina during ETT: NO Quality of ETT:  adequate  ETT Interpretation:  Clinicically and electrically negative for ischemia.

## 2012-07-20 DIAGNOSIS — J3089 Other allergic rhinitis: Secondary | ICD-10-CM | POA: Diagnosis not present

## 2012-07-20 DIAGNOSIS — J3081 Allergic rhinitis due to animal (cat) (dog) hair and dander: Secondary | ICD-10-CM | POA: Diagnosis not present

## 2012-07-20 DIAGNOSIS — J301 Allergic rhinitis due to pollen: Secondary | ICD-10-CM | POA: Diagnosis not present

## 2012-08-16 ENCOUNTER — Ambulatory Visit (INDEPENDENT_AMBULATORY_CARE_PROVIDER_SITE_OTHER): Payer: Medicare Other | Admitting: Internal Medicine

## 2012-08-16 VITALS — BP 127/65 | HR 83 | Ht 64.0 in | Wt 107.0 lb

## 2012-08-16 DIAGNOSIS — R079 Chest pain, unspecified: Secondary | ICD-10-CM

## 2012-08-16 DIAGNOSIS — R0989 Other specified symptoms and signs involving the circulatory and respiratory systems: Secondary | ICD-10-CM | POA: Diagnosis not present

## 2012-08-16 DIAGNOSIS — R0609 Other forms of dyspnea: Secondary | ICD-10-CM

## 2012-08-16 DIAGNOSIS — R0600 Dyspnea, unspecified: Secondary | ICD-10-CM

## 2012-08-16 DIAGNOSIS — M349 Systemic sclerosis, unspecified: Secondary | ICD-10-CM

## 2012-08-16 NOTE — Progress Notes (Signed)
HPI  Patient is a 42 yo who presents for return evaluation. She has a diagnosis of scleroderma.  Referred to me earlier this winter for evaluation of CP Pain atypical for coronary ischemia.  She was seen by Flavia Shipper.  He scheduled a treadmill test I supervised this test.  EKG showed no changes to suggest ischemia.  She became SOB during test.  Sats were difficult to track  QUestion if decreased to 80s. With walking after her O2 sats stayed in mid 90s The patient continues to have intermitt chest pains.  Sharp/stabbing  Still gets SOB with acitvity  Allergies  Allergen Reactions  . Motrin (Ibuprofen) Shortness Of Breath  . Peanut-Containing Drug Products Anaphylaxis    Current Outpatient Prescriptions  Medication Sig Dispense Refill  . albuterol (PROAIR HFA) 108 (90 BASE) MCG/ACT inhaler Inhale 2 puffs into the lungs every 6 (six) hours as needed.      Marland Kitchen aspirin 81 MG tablet Take 81 mg by mouth as needed.       Marland Kitchen VITAMIN D, CHOLECALCIFEROL, PO Take 1 tablet by mouth daily.       No current facility-administered medications for this visit.    Past Medical History  Diagnosis Date  . Chronic bronchitis   . Sarcoid   . HTN (hypertension)   . Asthma   . Thyromegaly   . Chest pain     a. 04/2012 Echo: EF 65%, nl wall motion.    No past surgical history on file.  No family history on file.  History   Social History  . Marital Status: Single    Spouse Name: N/A    Number of Children: N/A  . Years of Education: N/A   Occupational History  . Not on file.   Social History Main Topics  . Smoking status: Never Smoker   . Smokeless tobacco: Not on file  . Alcohol Use: No  . Drug Use: No  . Sexually Active:    Other Topics Concern  . Not on file   Social History Narrative  . No narrative on file    Review of Systems:  All systems reviewed.  They are negative to the above problem except as previously stated.  Vital Signs: BP 127/65  Pulse 83  Ht 5\' 4"  (1.626 m)  Wt  107 lb (48.535 kg)  BMI 18.36 kg/m2  Physical Exam  No exam done.  Assessment and Plan:  I have reviewed Ms Boltz' outside records.  I did not see a pathology report but she said a lymph node biopsy supported dx of scleroderma SHe had normal PFTs done by primary MD I am not sure what is causing patient's CP  I am not convinced it is cardiac in origin.  SOB also. I would recomm that she consider being seen at [facility] in their scerloderma clnic I would recomm this before scheduling any other testing. Patient understands.  Is agreeable.

## 2012-09-22 DIAGNOSIS — J3089 Other allergic rhinitis: Secondary | ICD-10-CM | POA: Diagnosis not present

## 2012-09-22 DIAGNOSIS — J3081 Allergic rhinitis due to animal (cat) (dog) hair and dander: Secondary | ICD-10-CM | POA: Diagnosis not present

## 2012-09-22 DIAGNOSIS — J301 Allergic rhinitis due to pollen: Secondary | ICD-10-CM | POA: Diagnosis not present

## 2012-09-29 ENCOUNTER — Telehealth: Payer: Self-pay | Admitting: Internal Medicine

## 2012-09-29 NOTE — Telephone Encounter (Signed)
Pt Signed ROI, Mailed LOVx3 To Pt home address  09/29/12/KM

## 2012-09-30 DIAGNOSIS — N6019 Diffuse cystic mastopathy of unspecified breast: Secondary | ICD-10-CM | POA: Diagnosis not present

## 2012-10-13 DIAGNOSIS — N6019 Diffuse cystic mastopathy of unspecified breast: Secondary | ICD-10-CM | POA: Diagnosis not present

## 2012-11-21 DIAGNOSIS — G4710 Hypersomnia, unspecified: Secondary | ICD-10-CM | POA: Diagnosis not present

## 2012-11-22 ENCOUNTER — Telehealth: Payer: Self-pay | Admitting: Internal Medicine

## 2012-11-22 NOTE — Telephone Encounter (Signed)
[employer]ercise [employer] For Pt called her she is Aware ready for Pick Up 11/22/12/KM

## 2012-11-23 DIAGNOSIS — K3189 Other diseases of stomach and duodenum: Secondary | ICD-10-CM | POA: Diagnosis not present

## 2012-11-23 DIAGNOSIS — R5383 Other fatigue: Secondary | ICD-10-CM | POA: Diagnosis not present

## 2012-11-23 DIAGNOSIS — D869 Sarcoidosis, unspecified: Secondary | ICD-10-CM | POA: Diagnosis not present

## 2012-11-23 DIAGNOSIS — M549 Dorsalgia, unspecified: Secondary | ICD-10-CM | POA: Diagnosis not present

## 2012-11-23 DIAGNOSIS — R7301 Impaired fasting glucose: Secondary | ICD-10-CM | POA: Diagnosis not present

## 2012-11-23 DIAGNOSIS — N898 Other specified noninflammatory disorders of vagina: Secondary | ICD-10-CM | POA: Diagnosis not present

## 2012-11-23 DIAGNOSIS — Z124 Encounter for screening for malignant neoplasm of cervix: Secondary | ICD-10-CM | POA: Diagnosis not present

## 2012-11-23 DIAGNOSIS — J45909 Unspecified asthma, uncomplicated: Secondary | ICD-10-CM | POA: Diagnosis not present

## 2012-11-23 DIAGNOSIS — I10 Essential (primary) hypertension: Secondary | ICD-10-CM | POA: Diagnosis not present

## 2012-11-23 DIAGNOSIS — J3089 Other allergic rhinitis: Secondary | ICD-10-CM | POA: Diagnosis not present

## 2012-11-23 DIAGNOSIS — R946 Abnormal results of thyroid function studies: Secondary | ICD-10-CM | POA: Diagnosis not present

## 2012-11-25 DIAGNOSIS — J45909 Unspecified asthma, uncomplicated: Secondary | ICD-10-CM | POA: Diagnosis not present

## 2012-11-25 DIAGNOSIS — R0602 Shortness of breath: Secondary | ICD-10-CM | POA: Diagnosis not present

## 2012-11-25 DIAGNOSIS — R0982 Postnasal drip: Secondary | ICD-10-CM | POA: Diagnosis not present

## 2012-11-25 DIAGNOSIS — D869 Sarcoidosis, unspecified: Secondary | ICD-10-CM | POA: Diagnosis not present

## 2012-12-09 DIAGNOSIS — D869 Sarcoidosis, unspecified: Secondary | ICD-10-CM | POA: Diagnosis not present

## 2012-12-09 DIAGNOSIS — K649 Unspecified hemorrhoids: Secondary | ICD-10-CM | POA: Diagnosis not present

## 2012-12-09 DIAGNOSIS — N898 Other specified noninflammatory disorders of vagina: Secondary | ICD-10-CM | POA: Diagnosis not present

## 2012-12-09 DIAGNOSIS — I10 Essential (primary) hypertension: Secondary | ICD-10-CM | POA: Diagnosis not present

## 2012-12-09 DIAGNOSIS — J45909 Unspecified asthma, uncomplicated: Secondary | ICD-10-CM | POA: Diagnosis not present

## 2012-12-09 DIAGNOSIS — R946 Abnormal results of thyroid function studies: Secondary | ICD-10-CM | POA: Diagnosis not present

## 2012-12-09 DIAGNOSIS — M549 Dorsalgia, unspecified: Secondary | ICD-10-CM | POA: Diagnosis not present

## 2012-12-18 DIAGNOSIS — D869 Sarcoidosis, unspecified: Secondary | ICD-10-CM | POA: Diagnosis not present

## 2012-12-18 DIAGNOSIS — Z883 Allergy status to other anti-infective agents status: Secondary | ICD-10-CM | POA: Diagnosis not present

## 2012-12-18 DIAGNOSIS — T783XXA Angioneurotic edema, initial encounter: Secondary | ICD-10-CM | POA: Diagnosis not present

## 2012-12-18 DIAGNOSIS — Z886 Allergy status to analgesic agent status: Secondary | ICD-10-CM | POA: Diagnosis not present

## 2012-12-23 ENCOUNTER — Encounter (INDEPENDENT_AMBULATORY_CARE_PROVIDER_SITE_OTHER): Payer: Self-pay | Admitting: Surgery

## 2012-12-23 ENCOUNTER — Ambulatory Visit (INDEPENDENT_AMBULATORY_CARE_PROVIDER_SITE_OTHER): Payer: Medicare Other | Admitting: Surgery

## 2012-12-23 VITALS — BP 108/68 | HR 80 | Temp 98.0°F | Resp 16 | Ht 62.5 in | Wt 107.4 lb

## 2012-12-23 DIAGNOSIS — K648 Other hemorrhoids: Secondary | ICD-10-CM

## 2012-12-23 MED ORDER — BUPIVACAINE LIPOSOME 1.3 % IJ SUSP: 20.0000 mL | INTRAMUSCULAR | Status: DC

## 2012-12-23 NOTE — Progress Notes (Signed)
Subjective:     Patient ID: Rebecca Sanchez, female   DOB: [birth year], 42 y.o.   MRN: [id number]  HPI  Rebecca Sanchez  Apr 26, 1971 [id number]  Patient Care Team: Jackie Plum, MD as PCP - General (Internal Medicine)  This patient is a 42 y.o.female who presents today for surgical evaluation at the request of Dr. Julio Sicks.   Reason for visit: "I need to have my hemorrhoids removed."  Pleasant woman with scleroderma.  Has struggled with anal pain for over a decade.  Discomfort with bowel movements.  Often painful.  Occasional blood.  Does not take a fiber supplement but usually has a bowel movement every day.  She feels lumps on the outside of her anus.  She thinks she gets a foul order them.  This thinks they are unsightly.  She wishes to have them removed.  She had a colonoscopy at [facility] digestive that was reportedly normal according to her.  We worked to get that report.  She can walk a mile without difficulty.  She does have scleroderma but those issues seem to be stable.  Occasionally needs prednisone.  No personal nor family history of GI/colon cancer, inflammatory bowel disease, irritable bowel syndrome, allergy such as Celiac Sprue, dietary/dairy problems, colitis, ulcers nor gastritis.  No recent sick contacts/gastroenteritis.  No travel outside the country.  No changes in diet.    Patient Active Problem List   Diagnosis Date Noted  . Chest pain 06/17/2012  . Sarcoid 06/17/2012  . Chronic bronchitis     Past Medical History  Diagnosis Date  . Chronic bronchitis   . Sarcoid   . HTN (hypertension)   . Asthma   . Thyromegaly   . Chest pain     a. 04/2012 Echo: EF 65%, nl wall motion.  . Anemia   . GERD (gastroesophageal reflux disease)   . Heart murmur     Past Surgical History  Procedure Laterality Date  . Hernia repair    . Bunionectomy      History   Social History  . Marital Status: Single    Spouse Name: N/A    Number of Children: N/A  . Years  of Education: N/A   Occupational History  . Not on file.   Social History Main Topics  . Smoking status: Never Smoker   . Smokeless tobacco: Never Used  . Alcohol Use: No  . Drug Use: No  . Sexually Active: Not on file   Other Topics Concern  . Not on file   Social History Narrative  . No narrative on file    Family History  Problem Relation Age of Onset  . Cancer Maternal Grandmother     lung  . Cancer Paternal Grandmother     lung    Current Outpatient Prescriptions  Medication Sig Dispense Refill  . albuterol (PROAIR HFA) 108 (90 BASE) MCG/ACT inhaler Inhale 2 puffs into the lungs every 6 (six) hours as needed.      . predniSONE (DELTASONE) 20 MG tablet Take 20 mg by mouth daily.       No current facility-administered medications for this visit.     Allergies  Allergen Reactions  . Azithromycin Shortness Of Breath  . Motrin (Ibuprofen) Shortness Of Breath  . Peanut-Containing Drug Products Anaphylaxis    BP 108/68  Pulse 80  Temp(Src) 98 F (36.7 C) (Temporal)  Resp 16  Ht 5' 2.5" (1.588 m)  Wt 107 lb 6.4 oz (48.716 kg)  BMI 19.32 kg/m2  No results found.   Review of Systems  Constitutional: Negative for fever, chills, diaphoresis, appetite change and fatigue.  HENT: Negative for ear pain, sore throat, trouble swallowing, neck pain and ear discharge.   Eyes: Negative for photophobia, discharge and visual disturbance.  Respiratory: Negative for cough, choking, chest tightness and shortness of breath.   Cardiovascular: Negative for chest pain and palpitations.  Gastrointestinal: Negative for nausea, vomiting, abdominal pain, diarrhea, constipation, anal bleeding and rectal pain.  Endocrine: Negative for cold intolerance and heat intolerance.  Genitourinary: Negative for dysuria, frequency and difficulty urinating.  Musculoskeletal: Negative for myalgias and gait problem.  Skin: Negative for color change, pallor and rash.  Allergic/Immunologic:  Negative for environmental allergies, food allergies and immunocompromised state.  Neurological: Negative for dizziness, speech difficulty, weakness and numbness.  Hematological: Negative for adenopathy.  Psychiatric/Behavioral: Negative for confusion and agitation. The patient is not nervous/anxious.        Objective:   Physical Exam  Constitutional: She is oriented to person, place, and time. She appears well-developed and well-nourished. No distress.  HENT:  Head: Normocephalic.  Mouth/Throat: Oropharynx is clear and moist. No oropharyngeal exudate.  Eyes: Conjunctivae and EOM are normal. Pupils are equal, round, and reactive to light. No scleral icterus.  Neck: Normal range of motion. Neck supple. No tracheal deviation present.  Cardiovascular: Normal rate, regular rhythm and intact distal pulses.   Pulmonary/Chest: Effort normal and breath sounds normal. No stridor. No respiratory distress. She exhibits no tenderness.  Abdominal: Soft. She exhibits no distension and no mass. There is no tenderness. Hernia confirmed negative in the right inguinal area and confirmed negative in the left inguinal area.  Genitourinary: No vaginal discharge found.  Exam done with assistance of female Medical Assistant in the room.  Perianal skin clean with good hygiene.  No pruritis.  Some excess anal skin folds.  No pilonidal disease.  No fissure.  No abscess/fistula.    Tolerates digital and anoscopic rectal exam.  Normal sphincter tone.  No rectal masses.  Hemorrhoidal piles enlarged R ant midline = R post > L lat.  Very sensitive.   Musculoskeletal: Normal range of motion. She exhibits no tenderness.       Right elbow: She exhibits normal range of motion.       Left elbow: She exhibits normal range of motion.       Right wrist: She exhibits normal range of motion.       Left wrist: She exhibits normal range of motion.       Right hand: Normal strength noted.       Left hand: Normal strength noted.   Lymphadenopathy:       Head (right side): No posterior auricular adenopathy present.       Head (left side): No posterior auricular adenopathy present.    She has no cervical adenopathy.    She has no axillary adenopathy.       Right: No inguinal adenopathy present.       Left: No inguinal adenopathy present.  Neurological: She is alert and oriented to person, place, and time. No cranial nerve deficit. She exhibits normal muscle tone. Coordination normal.  Skin: Skin is warm and dry. No rash noted. She is not diaphoretic. No erythema.  Psychiatric: She has a normal mood and affect. Her behavior is normal. Judgment and thought content normal.       Assessment:     Internal hemorrhoids with intermittent 2 pile prolapse and pain.  Plan:     I discussed options.  I thought banding would be a reasonable option to start with.  The anatomy & physiology of the anorectal region was discussed.  The pathophysiology of hemorrhoids and differential diagnosis was discussed.  Natural history progression  was discussed.   I stressed the importance of a bowel regimen to have daily soft bowel movements to minimize progression of disease.     The patient's symptoms are not adequately controlled.  Therefore, I recommended banding to treat the hemorrhoids.  I went over the technique, risks, benefits, and alternatives.   Goals of post-operative recovery were discussed as well.  Questions were answered.  The patient expressed understanding & wished to proceed.  The patient was positioned in the lateral decubitus position.  Perianal & rectal examination was done.  Using anoscopy, I attempted to ligate the hemorrhoids with banding.  However, the lower rectum and the more piles were far too sensitive.  Even when I was 5 cm up the anus, she was still very sensitive.  I therefore did not proceed with banding or injection.  At this point, the only option left is to do surgery.  Because I think there is a  component of prolapse and vault involving more than one pile,  I recommended hemorrhoidal ligation and pexy by THD.  I discussed this with her:  The anatomy & physiology of the anorectal region was discussed.  The pathophysiology of hemorrhoids and differential diagnosis was discussed.  Natural history risks without surgery was discussed.   I stressed the importance of a bowel regimen to have daily soft bowel movements to minimize progression of disease.  Interventions such as sclerotherapy & banding were discussed.  The patient's symptoms are not adequately controlled by medicines and other non-operative treatments.  I feel the risks & problems of no surgery outweigh the operative risks; therefore, I recommended surgery to treat the hemorrhoids by ligation, pexy, and possible resection.  Risks such as bleeding, infection, need for further treatment, heart attack, death, and other risks were discussed.   I noted a good likelihood this will help address the problem.  Goals of post-operative recovery were discussed as well.  Possibility that this will not correct all symptoms was explained.  Post-operative pain, bleeding, constipation, and other problems after surgery were discussed.  We will work to minimize complications.   Educational handouts further explaining the pathology, treatment options, and bowel regimen were given as well.  Questions were answered.  The patient expresses understanding & wishes to think about things before proceeding with surgery.  And then try to explain the justification for doing pexied to help correct the excess tissue back in.  I do not think she has specific tags that needed to be removed per se.   I noted she can get a second opinion elsewhere if she wishes.  I noted she can try the bowel regimen as well.  I think the [facility] offers a good way to avoid severe scarring in recovery is faster than with standard larger hemorrhoidectomy.     Marland Kitchen

## 2012-12-23 NOTE — Patient Instructions (Addendum)
Please see handouts.  Consider THD surgery to help ligate and shrink the hemorrhoids down to a more normal size.  HEMORRHOIDS  The rectum is the last foot of your colon, and it naturally stretches to hold stool.  Hemorrhoidal piles are natural clusters of blood vessels that help the rectum and anal canal stretch to hold stool and allow bowel movements to eliminate feces.   Hemorrhoids are abnormally swollen blood vessels in the rectum.  Too much pressure in the rectum causes hemorrhoids by forcing blood to stretch and bulge the walls of the veins, sometimes even rupturing them.  Hemorrhoids can become like varicose veins you might see on a person's legs.  Most people will develop a flare of hemorrhoids in their lifetime.  When bulging hemorrhoidal veins are irritated, they can swell, burn, itch, cause pain, and bleed.  Most flares will calm down gradually own within a few weeks.  However, once hemorrhoids are created, they are difficult to get rid of completely and tend to flare more easily than the first flare.   Fortunately, good habits and simple medical treatment usually control hemorrhoids well, and surgery is needed only in severe cases. Types of Hemorrhoids:  Internal hemorrhoids usually don't initially hurt or itch; they are deep inside the rectum and usually have no sensation. If they begin to push out (prolapse), pain and burning can occur.  However, internal hemorrhoids can bleed.  Anal bleeding should not be ignored since bleeding could come from a dangerous source like colorectal cancer, so persistent rectal bleeding should be investigated by a doctor, sometimes with a colonoscopy.  External hemorrhoids cause most of the symptoms - pain, burning, and itching. Nonirritated hemorrhoids can look like small skin tags coming out of the anus.   Thrombosed hemorrhoids can form when a hemorrhoid blood vessel bursts and causes the hemorrhoid to suddenly swell.  A purple blood clot can form in it and  become an excruciatingly painful lump at the anus. Because of these unpleasant symptoms, immediate incision and drainage by a surgeon at an office visit can provide much relief of the pain.    PREVENTION Avoiding the most frequent causes listed below will prevent most cases of hemorrhoids: Constipation Hard stools Diarrhea  Constant sitting  Straining with bowel movements Sitting on the toilet for a long time  Severe coughing  episodes Pregnancy / Childbirth  Heavy Lifting  Sometimes avoiding the above triggers is difficult:  How can you avoid sitting all day if you have a seated job? Also, we try to avoid coughing and diarrhea, but sometimes it's beyond your control.  Still, there are some practical hints to help: Keep the anal and genital area clean.  Moistened tissues such as flushable wet wipes are less irritating than toilet paper.  Using irrigating showers or bottle irrigation washing gently cleans this sensitive area.   Avoid dry toilet paper when cleaning after bowel movements.  Marland Kitchen Keep the anal and genital area dry.  Lightly pat the rectal area dry.  Avoid rubbing.  Talcum or baby powders can help GET YOUR STOOLS SOFT.   This is the most important way to prevent irritated hemorrhoids.  Hard stools are like sandpaper to the anorectal canal and will cause more problems.  The goal: ONE SOFT BOWEL MOVEMENT A DAY!  BMs from every other day to 3 times a day is a tolerable range Treat coughing, diarrhea and constipation early since irritated hemorrhoids may soon follow.  If your main job activity is seated,  always stand or walk during your breaks. Make it a point to stand and walk at least 5 minutes every hour and try to shift frequently in your chair to avoid direct rectal pressure.  Always exhale as you strain or lift. Don't hold your breath.  Do not delay or try to prevent a bowel movement when the urge is present. Exercise regularly (walking or jogging 60 minutes a day) to stimulate the  bowels to move. No reading or other activity while on the toilet. If bowel movements take longer than 5 minutes, you are too constipated. AVOID CONSTIPATION Drink plenty of liquids (1 1/2 to 2 quarts of water and other fluids a day unless fluid restricted for another medical condition). Liquids that contain caffeine (coffee a, tea, soft drinks) can be dehydrating and should be avoided until constipation is controlled. Consider minimizing milk, as dairy products may be constipating. Eat plenty of fiber (30g a day ideal, more if needed).  Fiber is the undigested part of plant food that passes into the colon, acting as "natures broom" to encourage bowel motility and movement.  Fiber can absorb and hold large amounts of water. This results in a larger, bulkier stool, which is soft and easier to pass.  Eating foods high in fiber - 12 servings - such as  Vegetables: Root (potatoes, carrots, turnips), Leafy green (lettuce, salad greens, celery, spinach), High residue (cabbage, broccoli, etc.) Fruit: Fresh, Dried (prunes, apricots, cherries), Stewed (applesauce)  Whole grain breads, pasta, whole wheat Bran cereals, muffins, etc. Consider adding supplemental bulking fiber which retains large volumes of water: Psyllium ground seeds --available as Metamucil, Konsyl, Effersyllium, Per Diem Fiber, or the less expensive generic forms.  Citrucel  (methylcellulose wood fiber) . FiberCon (Polycarbophil) Polyethylene Glycol - and "artificial" fiber commonly called Miralax or Glycolax.  It is helpful for people with gassy or bloated feelings with regular fiber Flax Seed - a less gassy natural fiber  Laxatives can be useful for a short period if constipation is severe Osmotics (Milk of Magnesia, Fleets Phospho-Soda, Magnesium Citrate)  Stimulants (Senokot,   Castor Oil,  Dulcolax, Ex-Lax)    Laxatives are not a good long-term solution as it can stress the bowels and cause too much mineral loss and dehydration.    Avoid taking laxatives for more than 7 days in a row.  AVOID DIARRHEA Switch to liquids and simpler foods for a few days to avoid stressing your intestines further. Avoid dairy products (especially milk & ice cream) for a short time.  The intestines often can lose the ability to digest lactose when stressed. Avoid foods that cause gassiness or bloating.  Typical foods include beans and other legumes, cabbage, broccoli, and dairy foods.  Every person has some sensitivity to other foods, so listen to your body and avoid those foods that trigger problems for you. Adding fiber (Citrucel, Metamucil, FiberCon, Flax seed, Miralax) gradually can help thicken stools by absorbing excess fluid and retrain the intestines to act more normally.  Slowly increase the dose over a few weeks.  Too much fiber too soon can backfire and cause cramping & bloating. Probiotics (such as active yogurt, Align, etc) may help repopulate the intestines and colon with normal bacteria and calm down a sensitive digestive tract.  Most studies show it to be of mild help, though, and such products can be costly. Medicines: Bismuth subsalicylate (ex. Kayopectate, Pepto Bismol) every 30 minutes for up to 6 doses can help control diarrhea.  Avoid if pregnant. Loperamide (Immodium)  can slow down diarrhea.  Start with two tablets (4mg  total) first and then try one tablet every 6 hours.  Avoid if you are having fevers or severe pain.  If you are not better or start feeling worse, stop all medicines and call your doctor for advice Call your doctor if you are getting worse or not better.  Sometimes further testing (cultures, endoscopy, X-ray studies, bloodwork, etc) may be needed to help diagnose and treat the cause of the diarrhea. TREATMENT OF HEMORRHOID FLARE If these preventive measures fail, you must take action right away! Hemorrhoids are one condition that can be mild in the morning and become intolerable by nightfall. Most hemorrhoidal  flares take several weeks to calm down.  These suggestions can help: Warm soaks.  This helps more than any topical medication.  Use up to 8 times a day.  Usually sitz baths or sitting in a warm bathtub helps.  Sitting on moist warm towels are helpful.  Switching to ice packs/cool compresses can be helpful Normalize your bowels.  Extremes of diarrhea or constipation will make hemorrhoids worse.  One soft bowel movement a day is the goal.  Fiber can help get your bowels regular Wet wipes instead of toilet paper Pain control with a NSAID such as ibuprofen (Advil) or naproxen (Aleve) or acetaminophen (Tylenol) around the clock.  Narcotics are constipating and should be minimized if possible Topical creams contain steroids (bydrocortisone) or local anesthetic (xylocaine) can help make pain and itching more tolerable.   EVALUATION If hemorrhoids are still causing problems, you could benefit by an evaluation by a surgeon.  The surgeon will obtain a history and examine you.  If hemorrhoids are diagnosed, some therapies can be offered in the office, usually with an anoscope into the less sensitive area of the rectum: -injection of hemorrhoids (sclerotherapy) can scar the blood vessels of the swollen/enlarged hemorrhoids to help shrink them down to a more normal size -rubber banding of the enlarged hemorrhoids to help shrink them down to a more normal size -drainage of the blood clot causing a thrombosed hemorrhoid,  to relieve the severe pain   While 90% of the time such problems from hemorrhoids can be managed without preceding to surgery, sometimes the hemorrhoids require a operation to control the problem (uncontrolled bleeding, prolapse, pain, etc.).   This involves being placed under general anesthesia where the surgeon can confirm the diagnosis and remove, suture, or staple the hemorrhoid(s).  Your surgeon can help you treat the problem appropriately.

## 2012-12-24 ENCOUNTER — Encounter (INDEPENDENT_AMBULATORY_CARE_PROVIDER_SITE_OTHER): Payer: Self-pay

## 2012-12-27 ENCOUNTER — Telehealth (INDEPENDENT_AMBULATORY_CARE_PROVIDER_SITE_OTHER): Payer: Self-pay | Admitting: Surgery

## 2012-12-27 NOTE — Telephone Encounter (Signed)
Spoke with pt and she is getting a second option and will call to schedule if need be. Placed in pending folder

## 2013-01-03 NOTE — Telephone Encounter (Signed)
As she chooses.Marland KitchenMarland Kitchen

## 2013-01-15 DIAGNOSIS — N898 Other specified noninflammatory disorders of vagina: Secondary | ICD-10-CM | POA: Diagnosis not present

## 2013-01-27 DIAGNOSIS — M549 Dorsalgia, unspecified: Secondary | ICD-10-CM | POA: Diagnosis not present

## 2013-01-27 DIAGNOSIS — R946 Abnormal results of thyroid function studies: Secondary | ICD-10-CM | POA: Diagnosis not present

## 2013-01-27 DIAGNOSIS — I10 Essential (primary) hypertension: Secondary | ICD-10-CM | POA: Diagnosis not present

## 2013-01-27 DIAGNOSIS — K649 Unspecified hemorrhoids: Secondary | ICD-10-CM | POA: Diagnosis not present

## 2013-01-27 DIAGNOSIS — J40 Bronchitis, not specified as acute or chronic: Secondary | ICD-10-CM | POA: Diagnosis not present

## 2013-01-27 DIAGNOSIS — J45909 Unspecified asthma, uncomplicated: Secondary | ICD-10-CM | POA: Diagnosis not present

## 2013-01-27 DIAGNOSIS — D869 Sarcoidosis, unspecified: Secondary | ICD-10-CM | POA: Diagnosis not present

## 2013-02-01 DIAGNOSIS — J069 Acute upper respiratory infection, unspecified: Secondary | ICD-10-CM | POA: Diagnosis not present

## 2013-02-07 DIAGNOSIS — Z09 Encounter for follow-up examination after completed treatment for conditions other than malignant neoplasm: Secondary | ICD-10-CM | POA: Diagnosis not present

## 2013-02-14 NOTE — Progress Notes (Signed)
Patient ID: Rebecca Sanchez, female   DOB: 1970/08/12, 42 y.o.   MRN: [id number]  Exercise Stress Test:  Patient is a 42 yo with history of CP, palpitations. Test to evaluate, r/o ischemia.  Patient exercised in the Bruce protocol.   Baseline EKG:  SR 94 bpm  Baseline BP 120/82 The patient exercised for 6 min 35 sec to a peak HR of 153 bpm.  (86% predicted maximal)  Peak BP 168/68.  The patient experienced no CP  EKG showed no ST changes to suggest ischemia.  Dietrich Pates

## 2013-03-03 DIAGNOSIS — N760 Acute vaginitis: Secondary | ICD-10-CM | POA: Diagnosis not present

## 2013-03-03 DIAGNOSIS — S31809A Unspecified open wound of unspecified buttock, initial encounter: Secondary | ICD-10-CM | POA: Diagnosis not present

## 2013-03-17 DIAGNOSIS — K649 Unspecified hemorrhoids: Secondary | ICD-10-CM | POA: Diagnosis not present

## 2013-03-17 DIAGNOSIS — D869 Sarcoidosis, unspecified: Secondary | ICD-10-CM | POA: Diagnosis not present

## 2013-03-17 DIAGNOSIS — I10 Essential (primary) hypertension: Secondary | ICD-10-CM | POA: Diagnosis not present

## 2013-03-17 DIAGNOSIS — J329 Chronic sinusitis, unspecified: Secondary | ICD-10-CM | POA: Diagnosis not present

## 2013-03-17 DIAGNOSIS — M549 Dorsalgia, unspecified: Secondary | ICD-10-CM | POA: Diagnosis not present

## 2013-03-17 DIAGNOSIS — R946 Abnormal results of thyroid function studies: Secondary | ICD-10-CM | POA: Diagnosis not present

## 2013-03-17 DIAGNOSIS — J45909 Unspecified asthma, uncomplicated: Secondary | ICD-10-CM | POA: Diagnosis not present

## 2013-03-25 DIAGNOSIS — K649 Unspecified hemorrhoids: Secondary | ICD-10-CM | POA: Diagnosis not present

## 2013-03-25 DIAGNOSIS — N898 Other specified noninflammatory disorders of vagina: Secondary | ICD-10-CM | POA: Diagnosis not present

## 2013-03-25 DIAGNOSIS — D869 Sarcoidosis, unspecified: Secondary | ICD-10-CM | POA: Diagnosis not present

## 2013-03-25 DIAGNOSIS — M549 Dorsalgia, unspecified: Secondary | ICD-10-CM | POA: Diagnosis not present

## 2013-03-25 DIAGNOSIS — J45909 Unspecified asthma, uncomplicated: Secondary | ICD-10-CM | POA: Diagnosis not present

## 2013-03-25 DIAGNOSIS — I10 Essential (primary) hypertension: Secondary | ICD-10-CM | POA: Diagnosis not present

## 2013-03-25 DIAGNOSIS — R109 Unspecified abdominal pain: Secondary | ICD-10-CM | POA: Diagnosis not present

## 2013-03-25 DIAGNOSIS — R946 Abnormal results of thyroid function studies: Secondary | ICD-10-CM | POA: Diagnosis not present

## 2013-03-28 DIAGNOSIS — J45909 Unspecified asthma, uncomplicated: Secondary | ICD-10-CM | POA: Diagnosis not present

## 2013-03-28 DIAGNOSIS — K649 Unspecified hemorrhoids: Secondary | ICD-10-CM | POA: Diagnosis not present

## 2013-03-28 DIAGNOSIS — R946 Abnormal results of thyroid function studies: Secondary | ICD-10-CM | POA: Diagnosis not present

## 2013-03-28 DIAGNOSIS — D869 Sarcoidosis, unspecified: Secondary | ICD-10-CM | POA: Diagnosis not present

## 2013-03-28 DIAGNOSIS — I10 Essential (primary) hypertension: Secondary | ICD-10-CM | POA: Diagnosis not present

## 2013-03-28 DIAGNOSIS — N898 Other specified noninflammatory disorders of vagina: Secondary | ICD-10-CM | POA: Diagnosis not present

## 2013-03-28 DIAGNOSIS — R002 Palpitations: Secondary | ICD-10-CM | POA: Diagnosis not present

## 2013-03-28 DIAGNOSIS — R109 Unspecified abdominal pain: Secondary | ICD-10-CM | POA: Diagnosis not present

## 2013-03-31 DIAGNOSIS — I10 Essential (primary) hypertension: Secondary | ICD-10-CM | POA: Diagnosis not present

## 2013-03-31 DIAGNOSIS — N898 Other specified noninflammatory disorders of vagina: Secondary | ICD-10-CM | POA: Diagnosis not present

## 2013-03-31 DIAGNOSIS — D869 Sarcoidosis, unspecified: Secondary | ICD-10-CM | POA: Diagnosis not present

## 2013-03-31 DIAGNOSIS — M549 Dorsalgia, unspecified: Secondary | ICD-10-CM | POA: Diagnosis not present

## 2013-03-31 DIAGNOSIS — K649 Unspecified hemorrhoids: Secondary | ICD-10-CM | POA: Diagnosis not present

## 2013-03-31 DIAGNOSIS — R946 Abnormal results of thyroid function studies: Secondary | ICD-10-CM | POA: Diagnosis not present

## 2013-03-31 DIAGNOSIS — K3189 Other diseases of stomach and duodenum: Secondary | ICD-10-CM | POA: Diagnosis not present

## 2013-03-31 DIAGNOSIS — J45909 Unspecified asthma, uncomplicated: Secondary | ICD-10-CM | POA: Diagnosis not present

## 2013-04-13 ENCOUNTER — Ambulatory Visit: Payer: Self-pay | Admitting: Obstetrics

## 2013-04-18 ENCOUNTER — Ambulatory Visit (INDEPENDENT_AMBULATORY_CARE_PROVIDER_SITE_OTHER): Payer: Medicare Other | Admitting: Obstetrics

## 2013-04-18 ENCOUNTER — Encounter: Payer: Self-pay | Admitting: Obstetrics

## 2013-04-18 VITALS — BP 126/81 | HR 90 | Temp 98.7°F | Ht 63.0 in | Wt 112.0 lb

## 2013-04-18 DIAGNOSIS — N760 Acute vaginitis: Secondary | ICD-10-CM | POA: Diagnosis not present

## 2013-04-18 DIAGNOSIS — D259 Leiomyoma of uterus, unspecified: Secondary | ICD-10-CM | POA: Diagnosis not present

## 2013-04-18 DIAGNOSIS — Z3202 Encounter for pregnancy test, result negative: Secondary | ICD-10-CM

## 2013-04-18 DIAGNOSIS — IMO0001 Reserved for inherently not codable concepts without codable children: Secondary | ICD-10-CM

## 2013-04-18 LAB — POCT URINE PREGNANCY: Preg Test, Ur: NEGATIVE

## 2013-04-18 NOTE — Progress Notes (Signed)
.   Subjective:     Rebecca Sanchez is a 42 y.o. female here for a problem exam.  Current complaints: Patient is here today for pelvic pain and an abnormal discharge for 4 weeks.  She would like all STD testing, including blood work. She would also like to discuss birth control.  Personal health questionnaire reviewed: yes.   Gynecologic History Patient's last menstrual period was 04/11/2013. Contraception: none Last Pap: 2014. Results were: normal Last mammogram: 2014. Results were: normal  Obstetric History OB History  No data available     The following portions of the patient's history were reviewed and updated as appropriate: allergies, current medications, past family history, past medical history, past social history, past surgical history and problem list.  Review of Systems Pertinent items are noted in HPI.    Objective:    General appearance: alert and no distress Abdomen: normal findings: soft, non-tender Pelvic: cervix normal in appearance, external genitalia normal, no adnexal masses or tenderness, no cervical motion tenderness, rectovaginal septum normal and vagina normal without discharge   Uterus retroverted enlarged, NT,    Assessment:    H/O Bacteria Vaginosis.  R/O uterine fibroids  Counseling for contraception   Plan:    Education reviewed: safe sex/STD prevention. Contraception: none. Follow up in: 2 weeks. Considering contraceptive options Ultrasound ordered.

## 2013-04-19 ENCOUNTER — Encounter: Payer: Self-pay | Admitting: Obstetrics

## 2013-04-19 ENCOUNTER — Telehealth: Payer: Self-pay | Admitting: *Deleted

## 2013-04-19 LAB — WET PREP BY MOLECULAR PROBE
Candida species: NEGATIVE
Gardnerella vaginalis: NEGATIVE
Trichomonas vaginosis: NEGATIVE

## 2013-04-19 LAB — GC/CHLAMYDIA PROBE AMP
CT Probe RNA: NEGATIVE
GC Probe RNA: NEGATIVE

## 2013-04-25 ENCOUNTER — Ambulatory Visit: Payer: Medicare Other | Admitting: Obstetrics

## 2013-05-02 ENCOUNTER — Ambulatory Visit: Payer: Medicare Other | Admitting: Obstetrics

## 2013-05-04 ENCOUNTER — Ambulatory Visit (HOSPITAL_COMMUNITY): Payer: Medicare Other

## 2013-05-08 ENCOUNTER — Other Ambulatory Visit: Payer: Self-pay | Admitting: Obstetrics

## 2013-05-08 DIAGNOSIS — D259 Leiomyoma of uterus, unspecified: Secondary | ICD-10-CM

## 2013-05-09 ENCOUNTER — Encounter: Payer: Self-pay | Admitting: Obstetrics

## 2013-05-09 ENCOUNTER — Ambulatory Visit (INDEPENDENT_AMBULATORY_CARE_PROVIDER_SITE_OTHER): Payer: Medicare Other | Admitting: Obstetrics

## 2013-05-09 ENCOUNTER — Ambulatory Visit (HOSPITAL_COMMUNITY)
Admission: RE | Admit: 2013-05-09 | Discharge: 2013-05-09 | Disposition: A | Discharge status: Home / Self Care | Payer: Medicare Other | Source: Ambulatory Visit | Attending: Obstetrics | Admitting: Obstetrics

## 2013-05-09 VITALS — BP 134/85 | HR 78 | Temp 96.3°F | Ht 65.0 in | Wt 106.0 lb

## 2013-05-09 DIAGNOSIS — D259 Leiomyoma of uterus, unspecified: Secondary | ICD-10-CM

## 2013-05-09 DIAGNOSIS — Z0000 Encounter for general adult medical examination without abnormal findings: Secondary | ICD-10-CM

## 2013-05-09 NOTE — Progress Notes (Signed)
Subjective:     Antonietta Lansdowne is a 42 y.o. female here for a routine exam.  Current complaints: in office to discuss ultrasound results.   Personal health questionnaire reviewed: yes.   Gynecologic History Patient's last menstrual period was 05/05/2013. Contraception: none  Last Pap: 2014. Results were: normal Last mammogram: 2014. Results were: normal  Obstetric History OB History  Gravida Para Term Preterm AB SAB TAB Ectopic Multiple Living  4 3 3  1 1         # Outcome Date GA Lbr Len/2nd Weight Sex Delivery Anes PTL Lv  4 TRM 12/14/01 [redacted]w[redacted]d         3 TRM 11/03/95 [redacted]w[redacted]d         2 TRM 11/29/88 [redacted]w[redacted]d         1 SAB                The following portions of the patient's history were reviewed and updated as appropriate: allergies, current medications, past family history, past medical history, past social history, past surgical history and problem list.  Assessment:    Uterine fibroid.  Asymptomatic.    Plan:    Education reviewed: calcium supplements, self breast exams and management of uterine fibroids. Contraception: Options discussed.  F/U with yearly pelvic.

## 2013-05-12 DIAGNOSIS — E538 Deficiency of other specified B group vitamins: Secondary | ICD-10-CM | POA: Diagnosis not present

## 2013-05-12 DIAGNOSIS — M549 Dorsalgia, unspecified: Secondary | ICD-10-CM | POA: Diagnosis not present

## 2013-05-12 DIAGNOSIS — K3189 Other diseases of stomach and duodenum: Secondary | ICD-10-CM | POA: Diagnosis not present

## 2013-05-12 DIAGNOSIS — R946 Abnormal results of thyroid function studies: Secondary | ICD-10-CM | POA: Diagnosis not present

## 2013-05-12 DIAGNOSIS — D869 Sarcoidosis, unspecified: Secondary | ICD-10-CM | POA: Diagnosis not present

## 2013-05-12 DIAGNOSIS — J45909 Unspecified asthma, uncomplicated: Secondary | ICD-10-CM | POA: Diagnosis not present

## 2013-05-12 DIAGNOSIS — K649 Unspecified hemorrhoids: Secondary | ICD-10-CM | POA: Diagnosis not present

## 2013-05-12 DIAGNOSIS — I10 Essential (primary) hypertension: Secondary | ICD-10-CM | POA: Diagnosis not present

## 2013-06-14 DIAGNOSIS — D869 Sarcoidosis, unspecified: Secondary | ICD-10-CM | POA: Diagnosis not present

## 2013-06-14 DIAGNOSIS — E538 Deficiency of other specified B group vitamins: Secondary | ICD-10-CM | POA: Diagnosis not present

## 2013-06-14 DIAGNOSIS — R946 Abnormal results of thyroid function studies: Secondary | ICD-10-CM | POA: Diagnosis not present

## 2013-06-14 DIAGNOSIS — K3189 Other diseases of stomach and duodenum: Secondary | ICD-10-CM | POA: Diagnosis not present

## 2013-06-14 DIAGNOSIS — I10 Essential (primary) hypertension: Secondary | ICD-10-CM | POA: Diagnosis not present

## 2013-06-14 DIAGNOSIS — J209 Acute bronchitis, unspecified: Secondary | ICD-10-CM | POA: Diagnosis not present

## 2013-06-14 DIAGNOSIS — M549 Dorsalgia, unspecified: Secondary | ICD-10-CM | POA: Diagnosis not present

## 2013-06-14 DIAGNOSIS — J45909 Unspecified asthma, uncomplicated: Secondary | ICD-10-CM | POA: Diagnosis not present

## 2013-06-20 DIAGNOSIS — J99 Respiratory disorders in diseases classified elsewhere: Secondary | ICD-10-CM | POA: Diagnosis not present

## 2013-06-20 DIAGNOSIS — J9801 Acute bronchospasm: Secondary | ICD-10-CM | POA: Diagnosis not present

## 2013-06-20 DIAGNOSIS — D869 Sarcoidosis, unspecified: Secondary | ICD-10-CM | POA: Diagnosis not present

## 2013-06-20 DIAGNOSIS — J209 Acute bronchitis, unspecified: Secondary | ICD-10-CM | POA: Diagnosis not present

## 2013-06-20 DIAGNOSIS — Z79899 Other long term (current) drug therapy: Secondary | ICD-10-CM | POA: Diagnosis not present

## 2013-08-08 DIAGNOSIS — N329 Bladder disorder, unspecified: Secondary | ICD-10-CM | POA: Diagnosis not present

## 2013-08-08 DIAGNOSIS — J45909 Unspecified asthma, uncomplicated: Secondary | ICD-10-CM | POA: Diagnosis not present

## 2013-08-08 DIAGNOSIS — T7840XA Allergy, unspecified, initial encounter: Secondary | ICD-10-CM | POA: Diagnosis not present

## 2013-08-08 DIAGNOSIS — K649 Unspecified hemorrhoids: Secondary | ICD-10-CM | POA: Diagnosis not present

## 2013-08-08 DIAGNOSIS — R946 Abnormal results of thyroid function studies: Secondary | ICD-10-CM | POA: Diagnosis not present

## 2013-08-08 DIAGNOSIS — D869 Sarcoidosis, unspecified: Secondary | ICD-10-CM | POA: Diagnosis not present

## 2013-08-08 DIAGNOSIS — K3189 Other diseases of stomach and duodenum: Secondary | ICD-10-CM | POA: Diagnosis not present

## 2013-08-08 DIAGNOSIS — M549 Dorsalgia, unspecified: Secondary | ICD-10-CM | POA: Diagnosis not present

## 2013-08-08 DIAGNOSIS — E538 Deficiency of other specified B group vitamins: Secondary | ICD-10-CM | POA: Diagnosis not present

## 2013-08-08 DIAGNOSIS — E559 Vitamin D deficiency, unspecified: Secondary | ICD-10-CM | POA: Diagnosis not present

## 2013-08-08 DIAGNOSIS — I10 Essential (primary) hypertension: Secondary | ICD-10-CM | POA: Diagnosis not present

## 2013-08-09 DIAGNOSIS — N760 Acute vaginitis: Secondary | ICD-10-CM | POA: Diagnosis not present

## 2013-08-15 DIAGNOSIS — R0602 Shortness of breath: Secondary | ICD-10-CM | POA: Diagnosis not present

## 2013-08-15 DIAGNOSIS — D869 Sarcoidosis, unspecified: Secondary | ICD-10-CM | POA: Diagnosis not present

## 2013-08-15 DIAGNOSIS — R0789 Other chest pain: Secondary | ICD-10-CM | POA: Diagnosis not present

## 2013-08-15 DIAGNOSIS — J99 Respiratory disorders in diseases classified elsewhere: Secondary | ICD-10-CM | POA: Diagnosis not present

## 2013-08-26 DIAGNOSIS — D869 Sarcoidosis, unspecified: Secondary | ICD-10-CM | POA: Diagnosis not present

## 2013-08-26 DIAGNOSIS — E538 Deficiency of other specified B group vitamins: Secondary | ICD-10-CM | POA: Diagnosis not present

## 2013-08-26 DIAGNOSIS — J45909 Unspecified asthma, uncomplicated: Secondary | ICD-10-CM | POA: Diagnosis not present

## 2013-08-26 DIAGNOSIS — K3189 Other diseases of stomach and duodenum: Secondary | ICD-10-CM | POA: Diagnosis not present

## 2013-08-26 DIAGNOSIS — K649 Unspecified hemorrhoids: Secondary | ICD-10-CM | POA: Diagnosis not present

## 2013-08-26 DIAGNOSIS — M549 Dorsalgia, unspecified: Secondary | ICD-10-CM | POA: Diagnosis not present

## 2013-08-26 DIAGNOSIS — I10 Essential (primary) hypertension: Secondary | ICD-10-CM | POA: Diagnosis not present

## 2013-08-26 DIAGNOSIS — R946 Abnormal results of thyroid function studies: Secondary | ICD-10-CM | POA: Diagnosis not present

## 2013-08-30 DIAGNOSIS — R0602 Shortness of breath: Secondary | ICD-10-CM | POA: Diagnosis not present

## 2013-08-30 DIAGNOSIS — R079 Chest pain, unspecified: Secondary | ICD-10-CM | POA: Diagnosis not present

## 2013-09-08 DIAGNOSIS — R0602 Shortness of breath: Secondary | ICD-10-CM | POA: Diagnosis not present

## 2013-09-08 DIAGNOSIS — R079 Chest pain, unspecified: Secondary | ICD-10-CM | POA: Diagnosis not present

## 2013-09-09 DIAGNOSIS — E538 Deficiency of other specified B group vitamins: Secondary | ICD-10-CM | POA: Diagnosis not present

## 2013-09-12 DIAGNOSIS — K649 Unspecified hemorrhoids: Secondary | ICD-10-CM | POA: Diagnosis not present

## 2013-09-12 DIAGNOSIS — N6459 Other signs and symptoms in breast: Secondary | ICD-10-CM | POA: Diagnosis not present

## 2013-09-12 DIAGNOSIS — M549 Dorsalgia, unspecified: Secondary | ICD-10-CM | POA: Diagnosis not present

## 2013-09-12 DIAGNOSIS — J45909 Unspecified asthma, uncomplicated: Secondary | ICD-10-CM | POA: Diagnosis not present

## 2013-09-12 DIAGNOSIS — N6019 Diffuse cystic mastopathy of unspecified breast: Secondary | ICD-10-CM | POA: Diagnosis not present

## 2013-09-12 DIAGNOSIS — I10 Essential (primary) hypertension: Secondary | ICD-10-CM | POA: Diagnosis not present

## 2013-09-12 DIAGNOSIS — E538 Deficiency of other specified B group vitamins: Secondary | ICD-10-CM | POA: Diagnosis not present

## 2013-09-12 DIAGNOSIS — K3189 Other diseases of stomach and duodenum: Secondary | ICD-10-CM | POA: Diagnosis not present

## 2013-09-12 DIAGNOSIS — R946 Abnormal results of thyroid function studies: Secondary | ICD-10-CM | POA: Diagnosis not present

## 2013-09-12 DIAGNOSIS — Z3040 Encounter for surveillance of contraceptives, unspecified: Secondary | ICD-10-CM | POA: Diagnosis not present

## 2013-09-12 DIAGNOSIS — D869 Sarcoidosis, unspecified: Secondary | ICD-10-CM | POA: Diagnosis not present

## 2013-09-12 DIAGNOSIS — T7840XA Allergy, unspecified, initial encounter: Secondary | ICD-10-CM | POA: Diagnosis not present

## 2013-10-26 DIAGNOSIS — I10 Essential (primary) hypertension: Secondary | ICD-10-CM | POA: Diagnosis not present

## 2013-10-26 DIAGNOSIS — T7840XA Allergy, unspecified, initial encounter: Secondary | ICD-10-CM | POA: Diagnosis not present

## 2013-10-26 DIAGNOSIS — N329 Bladder disorder, unspecified: Secondary | ICD-10-CM | POA: Diagnosis not present

## 2013-10-26 DIAGNOSIS — J45909 Unspecified asthma, uncomplicated: Secondary | ICD-10-CM | POA: Diagnosis not present

## 2013-10-26 DIAGNOSIS — Z3040 Encounter for surveillance of contraceptives, unspecified: Secondary | ICD-10-CM | POA: Diagnosis not present

## 2013-10-26 DIAGNOSIS — D869 Sarcoidosis, unspecified: Secondary | ICD-10-CM | POA: Diagnosis not present

## 2013-10-26 DIAGNOSIS — R946 Abnormal results of thyroid function studies: Secondary | ICD-10-CM | POA: Diagnosis not present

## 2013-10-26 DIAGNOSIS — E538 Deficiency of other specified B group vitamins: Secondary | ICD-10-CM | POA: Diagnosis not present

## 2014-01-17 DIAGNOSIS — J45909 Unspecified asthma, uncomplicated: Secondary | ICD-10-CM | POA: Diagnosis not present

## 2014-01-17 DIAGNOSIS — E119 Type 2 diabetes mellitus without complications: Secondary | ICD-10-CM | POA: Diagnosis not present

## 2014-01-17 DIAGNOSIS — D869 Sarcoidosis, unspecified: Secondary | ICD-10-CM | POA: Diagnosis not present

## 2014-01-17 DIAGNOSIS — T7840XA Allergy, unspecified, initial encounter: Secondary | ICD-10-CM | POA: Diagnosis not present

## 2014-01-17 DIAGNOSIS — I10 Essential (primary) hypertension: Secondary | ICD-10-CM | POA: Diagnosis not present

## 2014-01-17 DIAGNOSIS — R1013 Epigastric pain: Secondary | ICD-10-CM | POA: Diagnosis not present

## 2014-01-17 DIAGNOSIS — R109 Unspecified abdominal pain: Secondary | ICD-10-CM | POA: Diagnosis not present

## 2014-01-17 DIAGNOSIS — R5381 Other malaise: Secondary | ICD-10-CM | POA: Diagnosis not present

## 2014-01-17 DIAGNOSIS — K3189 Other diseases of stomach and duodenum: Secondary | ICD-10-CM | POA: Diagnosis not present

## 2014-01-17 DIAGNOSIS — E538 Deficiency of other specified B group vitamins: Secondary | ICD-10-CM | POA: Diagnosis not present

## 2014-01-17 DIAGNOSIS — R5383 Other fatigue: Secondary | ICD-10-CM | POA: Diagnosis not present

## 2014-01-17 DIAGNOSIS — K649 Unspecified hemorrhoids: Secondary | ICD-10-CM | POA: Diagnosis not present

## 2014-02-24 DIAGNOSIS — E538 Deficiency of other specified B group vitamins: Secondary | ICD-10-CM | POA: Diagnosis not present

## 2014-02-24 DIAGNOSIS — K640 First degree hemorrhoids: Secondary | ICD-10-CM | POA: Diagnosis not present

## 2014-02-24 DIAGNOSIS — E559 Vitamin D deficiency, unspecified: Secondary | ICD-10-CM | POA: Diagnosis not present

## 2014-02-24 DIAGNOSIS — K30 Functional dyspepsia: Secondary | ICD-10-CM | POA: Diagnosis not present

## 2014-02-24 DIAGNOSIS — D860 Sarcoidosis of lung: Secondary | ICD-10-CM | POA: Diagnosis not present

## 2014-02-24 DIAGNOSIS — J45909 Unspecified asthma, uncomplicated: Secondary | ICD-10-CM | POA: Diagnosis not present

## 2014-02-24 DIAGNOSIS — I10 Essential (primary) hypertension: Secondary | ICD-10-CM | POA: Diagnosis not present

## 2014-02-24 DIAGNOSIS — M549 Dorsalgia, unspecified: Secondary | ICD-10-CM | POA: Diagnosis not present

## 2014-02-24 DIAGNOSIS — J302 Other seasonal allergic rhinitis: Secondary | ICD-10-CM | POA: Diagnosis not present

## 2014-03-27 ENCOUNTER — Encounter: Payer: Self-pay | Admitting: Obstetrics

## 2014-04-07 DIAGNOSIS — K30 Functional dyspepsia: Secondary | ICD-10-CM | POA: Diagnosis not present

## 2014-04-07 DIAGNOSIS — E559 Vitamin D deficiency, unspecified: Secondary | ICD-10-CM | POA: Diagnosis not present

## 2014-04-07 DIAGNOSIS — E538 Deficiency of other specified B group vitamins: Secondary | ICD-10-CM | POA: Diagnosis not present

## 2014-04-07 DIAGNOSIS — I10 Essential (primary) hypertension: Secondary | ICD-10-CM | POA: Diagnosis not present

## 2014-04-07 DIAGNOSIS — J45909 Unspecified asthma, uncomplicated: Secondary | ICD-10-CM | POA: Diagnosis not present

## 2014-04-07 DIAGNOSIS — Z124 Encounter for screening for malignant neoplasm of cervix: Secondary | ICD-10-CM | POA: Diagnosis not present

## 2014-04-07 DIAGNOSIS — N760 Acute vaginitis: Secondary | ICD-10-CM | POA: Diagnosis not present

## 2014-04-07 DIAGNOSIS — D860 Sarcoidosis of lung: Secondary | ICD-10-CM | POA: Diagnosis not present

## 2014-04-10 DIAGNOSIS — N760 Acute vaginitis: Secondary | ICD-10-CM | POA: Diagnosis not present

## 2014-04-12 ENCOUNTER — Other Ambulatory Visit: Payer: Self-pay | Admitting: Internal Medicine

## 2014-04-12 DIAGNOSIS — N644 Mastodynia: Secondary | ICD-10-CM

## 2014-05-04 ENCOUNTER — Ambulatory Visit
Admission: RE | Admit: 2014-05-04 | Discharge: 2014-05-04 | Disposition: A | Discharge status: Home / Self Care | Payer: Medicare Other | Source: Ambulatory Visit | Attending: Internal Medicine | Admitting: Internal Medicine

## 2014-05-04 DIAGNOSIS — N644 Mastodynia: Secondary | ICD-10-CM | POA: Diagnosis not present

## 2014-05-04 DIAGNOSIS — N6452 Nipple discharge: Secondary | ICD-10-CM | POA: Diagnosis not present

## 2014-05-27 ENCOUNTER — Encounter (HOSPITAL_COMMUNITY): Payer: Self-pay | Admitting: Emergency Medicine

## 2014-05-27 ENCOUNTER — Emergency Department (HOSPITAL_COMMUNITY): Payer: Medicare Other

## 2014-05-27 ENCOUNTER — Emergency Department (HOSPITAL_COMMUNITY)
Admission: EM | Admit: 2014-05-27 | Discharge: 2014-05-27 | Disposition: A | Discharge status: Home / Self Care | Payer: Medicare Other | Attending: Emergency Medicine | Admitting: Emergency Medicine

## 2014-05-27 DIAGNOSIS — I10 Essential (primary) hypertension: Secondary | ICD-10-CM | POA: Diagnosis not present

## 2014-05-27 DIAGNOSIS — Z79899 Other long term (current) drug therapy: Secondary | ICD-10-CM | POA: Diagnosis not present

## 2014-05-27 DIAGNOSIS — Z8639 Personal history of other endocrine, nutritional and metabolic disease: Secondary | ICD-10-CM | POA: Insufficient documentation

## 2014-05-27 DIAGNOSIS — Z7982 Long term (current) use of aspirin: Secondary | ICD-10-CM | POA: Insufficient documentation

## 2014-05-27 DIAGNOSIS — J45909 Unspecified asthma, uncomplicated: Secondary | ICD-10-CM | POA: Diagnosis not present

## 2014-05-27 DIAGNOSIS — R079 Chest pain, unspecified: Secondary | ICD-10-CM | POA: Diagnosis not present

## 2014-05-27 DIAGNOSIS — K219 Gastro-esophageal reflux disease without esophagitis: Secondary | ICD-10-CM | POA: Insufficient documentation

## 2014-05-27 DIAGNOSIS — D869 Sarcoidosis, unspecified: Secondary | ICD-10-CM | POA: Insufficient documentation

## 2014-05-27 DIAGNOSIS — R011 Cardiac murmur, unspecified: Secondary | ICD-10-CM | POA: Insufficient documentation

## 2014-05-27 LAB — CBC
HCT: 36.2 % (ref 36.0–46.0)
Hemoglobin: 12.1 g/dL (ref 12.0–15.0)
MCH: 28.4 pg (ref 26.0–34.0)
MCHC: 33.4 g/dL (ref 30.0–36.0)
MCV: 85 fL (ref 78.0–100.0)
Platelets: 362 10*3/uL (ref 150–400)
RBC: 4.26 MIL/uL (ref 3.87–5.11)
RDW: 12.8 % (ref 11.5–15.5)
WBC: 6.5 10*3/uL (ref 4.0–10.5)

## 2014-05-27 LAB — BASIC METABOLIC PANEL
Anion gap: 6 (ref 5–15)
BUN: 7 mg/dL (ref 6–23)
CO2: 23 mmol/L (ref 19–32)
Calcium: 9 mg/dL (ref 8.4–10.5)
Chloride: 105 mEq/L (ref 96–112)
Creatinine, Ser: 0.47 mg/dL — ABNORMAL LOW (ref 0.50–1.10)
GFR calc Af Amer: >90 mL/min (ref >90)
GFR calc non Af Amer: >90 mL/min (ref >90)
Glucose, Bld: 84 mg/dL (ref 70–99)
Potassium: 3.7 mmol/L (ref 3.5–5.1)
Sodium: 134 mmol/L — ABNORMAL LOW (ref 135–145)

## 2014-05-27 LAB — I-STAT TROPONIN, ED: Troponin i, poc: 0 ng/mL (ref 0.00–0.08)

## 2014-05-27 MED ORDER — FAMOTIDINE 20 MG PO TABS: 20.0000 mg | ORAL_TABLET | Freq: Two times a day (BID) | ORAL | Status: DC

## 2014-05-27 MED ORDER — TRAMADOL HCL 50 MG PO TABS: 50.0000 mg | ORAL_TABLET | Freq: Four times a day (QID) | ORAL | Status: DC | PRN

## 2014-05-27 NOTE — ED Provider Notes (Signed)
CSN: [id number]     Arrival date & time 05/27/14  1638 History   First MD Initiated Contact with Patient 05/27/14 1844     Chief Complaint  Patient presents with  . Chest Pain  . Back Pain     (Consider location/radiation/quality/duration/timing/severity/associated sxs/prior Treatment) HPI The patient poor she had several episodes of burning quality pain over her whole left chest today. She reports she has had chest pain in the past and she indicates her costosternal margins. She reports however this quality of pain was different for her. She reports she did take aspirin earlier in the day and got pain relief from that. She reports the pain was also on her back and going down towards her lower back. She was when she was having it she did feel somewhat short of breath and nauseated. The patient reports she has a history of sarcoidosis and has some degree of chronic cough. She denies she's been having worsening or increasing cough. She denies she's had fever. She does report sometimes at night she feels sweaty. She has no lower extremity swelling or pain. Past Medical History  Diagnosis Date  . Chronic bronchitis   . Sarcoid   . HTN (hypertension)   . Asthma   . Thyromegaly   . Chest pain     a. 04/2012 Echo: EF 65%, nl wall motion.  . Anemia   . GERD (gastroesophageal reflux disease)   . Heart murmur    Past Surgical History  Procedure Laterality Date  . Hernia repair    . Bunionectomy     Family History  Problem Relation Age of Onset  . Cancer Maternal Grandmother     lung  . Cancer Paternal Grandmother     lung  . Hypertension Mother   . Hypertension Father    History  Substance Use Topics  . Smoking status: Never Smoker   . Smokeless tobacco: Never Used  . Alcohol Use: No   OB History    Gravida Para Term Preterm AB TAB SAB Ectopic Multiple Living   4 3 3  1  1         Review of Systems 10 Systems reviewed and are negative for acute change except as noted in the  HPI.    Allergies  Azithromycin; Motrin; and Peanut-containing drug products  Home Medications   Prior to Admission medications   Medication Sig Start Date End Date Taking? Authorizing Provider  albuterol (PROAIR HFA) 108 (90 BASE) MCG/ACT inhaler Inhale 2 puffs into the lungs every 6 (six) hours as needed for wheezing or shortness of breath.    Yes Historical Provider, MD  aspirin EC 81 MG tablet Take 243 mg by mouth once.   Yes Historical Provider, MD  famotidine (PEPCID) 20 MG tablet Take 1 tablet (20 mg total) by mouth 2 (two) times daily. 05/27/14   Charlesetta Shanks, MD  traMADol (ULTRAM) 50 MG tablet Take 1 tablet (50 mg total) by mouth every 6 (six) hours as needed. 05/27/14   Charlesetta Shanks, MD   BP 117/63 mmHg  Pulse 89  Temp(Src) 97.8 F (36.6 C) (Oral)  Resp 16  SpO2 100%  LMP 05/21/2014 Physical Exam  Constitutional: She is oriented to person, place, and time. She appears well-developed and well-nourished.  HENT:  Head: Normocephalic and atraumatic.  Eyes: EOM are normal. Pupils are equal, round, and reactive to light.  Neck: Neck supple.  Cardiovascular: Normal rate, regular rhythm, normal heart sounds and intact distal pulses.  Pulmonary/Chest: Effort normal and breath sounds normal.  Abdominal: Soft. Bowel sounds are normal. She exhibits no distension. There is no tenderness.  Musculoskeletal: Normal range of motion. She exhibits no edema.  Neurological: She is alert and oriented to person, place, and time. She has normal strength. Coordination normal. GCS eye subscore is 4. GCS verbal subscore is 5. GCS motor subscore is 6.  Skin: Skin is warm, dry and intact.  Psychiatric: She has a normal mood and affect.    ED Course  Procedures (including critical care time) Labs Review Labs Reviewed  BASIC METABOLIC PANEL - Abnormal; Notable for the following:    Sodium 134 (*)    Creatinine, Ser 0.47 (*)    All other components within normal limits  CBC  I-STAT  TROPOININ, ED    Imaging Review Dg Chest 2 View  05/27/2014   CLINICAL DATA:  Chest pain starting 11 this morning  EXAM: CHEST  2 VIEW  COMPARISON:  February 24, 2012  FINDINGS: The heart size and mediastinal contours are within normal limits. There is no focal infiltrate, pulmonary edema, or pleural effusion. The visualized skeletal structures are unremarkable.  IMPRESSION: No active cardiopulmonary disease.   Electronically Signed   By: Abelardo Diesel M.D.   On: 05/27/2014 19:58     EKG Interpretation None      MDM   Final diagnoses:  Chest pain, unspecified chest pain type  Sarcoidosis   At this point the patient's physical examination is normal. She does not have any hypoxia or unstable vital signs. PE seemed to be a very low probability. The patient does report a history of sarcoidosis, at this time however there are no remarkable findings on chest x-ray to suggest increasing nodules. Her diagnostic studies are within normal limits without any leukocytosis or history of fever. At this time I do not feel that empiric antibiotics were indicated based on lack of symptoms to suggest a pneumonia diagnosis. Consideration is given to possible GI type symptoms with this description of periodic burning quality chest pain, the patient will be started on Pepcid twice a day for a trial. Other considerations for possible muscle skeletal pain as well and as it is seems to involve the back and into the lower back. Patient is counseled if she has any additional development of symptoms or return of concerning pain quality she is to return for reassessment. Otherwise she is to see her family physician for recheck this week.    Charlesetta Shanks, MD 05/27/14 2209

## 2014-05-27 NOTE — ED Notes (Signed)
Patient c/o chest pain starting around 1100 this morning. Took three 81 mg Aspirin when she woke up at 1100. Says they did alleviate her pain for a few hours but pain is increasing at this time. Says she has never felt pain like this before. Chest pain is left lateral chest radiating to left lower back and stomach. Did experience nausea and SOB with chest pain. Also says she is having night sweats x3 nights. This is new for patient. Also having slight malaise. Neurologically intact. A&Ox4. Ambulatory.

## 2014-05-27 NOTE — Discharge Instructions (Signed)

## 2014-06-09 ENCOUNTER — Emergency Department (HOSPITAL_COMMUNITY)
Admission: EM | Admit: 2014-06-09 | Discharge: 2014-06-09 | Disposition: A | Discharge status: Home / Self Care | Payer: Medicare Other | Attending: Emergency Medicine | Admitting: Emergency Medicine

## 2014-06-09 ENCOUNTER — Encounter (HOSPITAL_COMMUNITY): Payer: Self-pay

## 2014-06-09 ENCOUNTER — Ambulatory Visit (HOSPITAL_COMMUNITY): Payer: Medicare Other

## 2014-06-09 DIAGNOSIS — Z79899 Other long term (current) drug therapy: Secondary | ICD-10-CM | POA: Diagnosis not present

## 2014-06-09 DIAGNOSIS — I10 Essential (primary) hypertension: Secondary | ICD-10-CM | POA: Diagnosis not present

## 2014-06-09 DIAGNOSIS — J45909 Unspecified asthma, uncomplicated: Secondary | ICD-10-CM | POA: Diagnosis not present

## 2014-06-09 DIAGNOSIS — R05 Cough: Secondary | ICD-10-CM | POA: Diagnosis not present

## 2014-06-09 DIAGNOSIS — Z862 Personal history of diseases of the blood and blood-forming organs and certain disorders involving the immune mechanism: Secondary | ICD-10-CM | POA: Diagnosis not present

## 2014-06-09 DIAGNOSIS — R197 Diarrhea, unspecified: Secondary | ICD-10-CM | POA: Diagnosis not present

## 2014-06-09 DIAGNOSIS — R0789 Other chest pain: Secondary | ICD-10-CM

## 2014-06-09 DIAGNOSIS — R011 Cardiac murmur, unspecified: Secondary | ICD-10-CM | POA: Insufficient documentation

## 2014-06-09 DIAGNOSIS — K219 Gastro-esophageal reflux disease without esophagitis: Secondary | ICD-10-CM | POA: Diagnosis not present

## 2014-06-09 DIAGNOSIS — R109 Unspecified abdominal pain: Secondary | ICD-10-CM

## 2014-06-09 DIAGNOSIS — Z8639 Personal history of other endocrine, nutritional and metabolic disease: Secondary | ICD-10-CM | POA: Insufficient documentation

## 2014-06-09 DIAGNOSIS — R112 Nausea with vomiting, unspecified: Secondary | ICD-10-CM | POA: Insufficient documentation

## 2014-06-09 DIAGNOSIS — Z7982 Long term (current) use of aspirin: Secondary | ICD-10-CM | POA: Diagnosis not present

## 2014-06-09 DIAGNOSIS — D259 Leiomyoma of uterus, unspecified: Secondary | ICD-10-CM | POA: Diagnosis not present

## 2014-06-09 DIAGNOSIS — R1033 Periumbilical pain: Secondary | ICD-10-CM | POA: Insufficient documentation

## 2014-06-09 LAB — COMPREHENSIVE METABOLIC PANEL
ALT: 30 U/L (ref 0–35)
AST: 24 U/L (ref 0–37)
Albumin: 3.5 g/dL (ref 3.5–5.2)
Alkaline Phosphatase: 54 U/L (ref 39–117)
Anion gap: 8 (ref 5–15)
BUN: 10 mg/dL (ref 6–23)
CO2: 23 mmol/L (ref 19–32)
Calcium: 8.7 mg/dL (ref 8.4–10.5)
Chloride: 106 mEq/L (ref 96–112)
Creatinine, Ser: 0.54 mg/dL (ref 0.50–1.10)
GFR calc Af Amer: >90 mL/min (ref >90)
GFR calc non Af Amer: >90 mL/min (ref >90)
Glucose, Bld: 79 mg/dL (ref 70–99)
Potassium: 3.9 mmol/L (ref 3.5–5.1)
Sodium: 137 mmol/L (ref 135–145)
Total Bilirubin: 0.6 mg/dL (ref 0.3–1.2)
Total Protein: 6.3 g/dL (ref 6.0–8.3)

## 2014-06-09 LAB — TROPONIN I: Troponin I: <0.03 ng/mL (ref <0.031)

## 2014-06-09 LAB — CBC WITH DIFFERENTIAL/PLATELET
Basophils Absolute: 0 10*3/uL (ref 0.0–0.1)
Basophils Relative: 0 % (ref 0–1)
Eosinophils Absolute: 0.1 10*3/uL (ref 0.0–0.7)
Eosinophils Relative: 2 % (ref 0–5)
HCT: 32.3 % — ABNORMAL LOW (ref 36.0–46.0)
Hemoglobin: 11.1 g/dL — ABNORMAL LOW (ref 12.0–15.0)
Lymphocytes Relative: 39 % (ref 12–46)
Lymphs Abs: 2.2 10*3/uL (ref 0.7–4.0)
MCH: 28.8 pg (ref 26.0–34.0)
MCHC: 34.4 g/dL (ref 30.0–36.0)
MCV: 83.7 fL (ref 78.0–100.0)
Monocytes Absolute: 0.6 10*3/uL (ref 0.1–1.0)
Monocytes Relative: 10 % (ref 3–12)
Neutro Abs: 2.8 10*3/uL (ref 1.7–7.7)
Neutrophils Relative %: 49 % (ref 43–77)
Platelets: 387 10*3/uL (ref 150–400)
RBC: 3.86 MIL/uL — ABNORMAL LOW (ref 3.87–5.11)
RDW: 12.7 % (ref 11.5–15.5)
WBC: 5.7 10*3/uL (ref 4.0–10.5)

## 2014-06-09 LAB — LIPASE, BLOOD: Lipase: 25 U/L (ref 11–59)

## 2014-06-09 MED ORDER — MORPHINE SULFATE 4 MG/ML IJ SOLN
4.0000 mg | Freq: Once | INTRAMUSCULAR | Status: AC
  Administered 2014-06-09: 4 mg via INTRAVENOUS
  Filled 2014-06-09: qty 1

## 2014-06-09 MED ORDER — SODIUM CHLORIDE 0.9 % IV BOLUS (SEPSIS)
1000.0000 mL | Freq: Once | INTRAVENOUS | Status: AC
  Administered 2014-06-09: 1000 mL via INTRAVENOUS

## 2014-06-09 MED ORDER — IOHEXOL 300 MG/ML  SOLN
80.0000 mL | Freq: Once | INTRAMUSCULAR | Status: AC | PRN
  Administered 2014-06-09: 80 mL via INTRAVENOUS

## 2014-06-09 MED ORDER — ONDANSETRON 4 MG PO TBDP: ORAL_TABLET | ORAL | Status: DC

## 2014-06-09 MED ORDER — IOHEXOL 300 MG/ML  SOLN
50.0000 mL | Freq: Once | INTRAMUSCULAR | Status: AC | PRN
  Administered 2014-06-09: 50 mL via ORAL

## 2014-06-09 NOTE — ED Notes (Signed)
Bed: WA03 Expected date:  Expected time:  Means of arrival:  Comments: EMS- elderly, lethargy

## 2014-06-09 NOTE — ED Provider Notes (Signed)
CSN: [id number]     Arrival date & time 06/09/14  0910 History   First MD Initiated Contact with Patient 06/09/14 [phone]     Chief Complaint  Patient presents with  . Abdominal Pain  . Emesis  . Diarrhea     (Consider location/radiation/quality/duration/timing/severity/associated sxs/prior Treatment) HPI Comments: 44 year old female with history of bronchitis, sarcoidosis presents for abdominal pain. Patient is had intermittent abdominal pain lasting 20-30 minutes for the past 3-4 weeks gradually worsening. Radiation up into central chest. No back radiation. No tearing sensation. Mild chart sensation. Feels like cramping or contractions. Patient had recent workup for cardiac with her intermittent chest discomfort. Currently no active chest pain, mild central umbilical abdominal discomfort. No rectal bleeding. No exertional chest pain or cardiac history.  Patient is a 44 y.o. female presenting with abdominal pain, vomiting, and diarrhea. The history is provided by the patient.  Abdominal Pain Associated symptoms: chest pain, cough, diarrhea and vomiting   Associated symptoms: no chills, no dysuria, no fever and no shortness of breath   Emesis Associated symptoms: abdominal pain and diarrhea   Associated symptoms: no chills and no headaches   Diarrhea Associated symptoms: abdominal pain and vomiting   Associated symptoms: no chills, no fever and no headaches     Past Medical History  Diagnosis Date  . Chronic bronchitis   . Sarcoid   . HTN (hypertension)   . Asthma   . Thyromegaly   . Chest pain     a. 04/2012 Echo: EF 65%, nl wall motion.  . Anemia   . GERD (gastroesophageal reflux disease)   . Heart murmur    Past Surgical History  Procedure Laterality Date  . Hernia repair    . Bunionectomy     Family History  Problem Relation Age of Onset  . Cancer Maternal Grandmother     lung  . Cancer Paternal Grandmother     lung  . Hypertension Mother   . Hypertension Father     History  Substance Use Topics  . Smoking status: Never Smoker   . Smokeless tobacco: Never Used  . Alcohol Use: No   OB History    Gravida Para Term Preterm AB TAB SAB Ectopic Multiple Living   4 3 3  1  1         Review of Systems  Constitutional: Negative for fever and chills.  HENT: Negative for congestion.   Eyes: Negative for visual disturbance.  Respiratory: Positive for cough. Negative for shortness of breath.   Cardiovascular: Positive for chest pain.  Gastrointestinal: Positive for vomiting, abdominal pain and diarrhea.  Genitourinary: Negative for dysuria and flank pain.  Musculoskeletal: Negative for back pain, neck pain and neck stiffness.  Skin: Negative for rash.  Neurological: Negative for light-headedness and headaches.      Allergies  Azithromycin; Motrin; and Peanut-containing drug products  Home Medications   Prior to Admission medications   Medication Sig Start Date End Date Taking? Authorizing Provider  albuterol (PROAIR HFA) 108 (90 BASE) MCG/ACT inhaler Inhale 2 puffs into the lungs every 6 (six) hours as needed for wheezing or shortness of breath.    Yes Historical Provider, MD  alum & mag hydroxide-simeth (MAALOX/MYLANTA) 200-200-20 MG/5ML suspension Take 15 mLs by mouth every 6 (six) hours as needed for indigestion or heartburn.   Yes Historical Provider, MD  aspirin EC 81 MG tablet Take 81 mg by mouth daily as needed for mild pain.    Yes Historical Provider, MD  famotidine (PEPCID) 20 MG tablet Take 1 tablet (20 mg total) by mouth 2 (two) times daily. Patient not taking: Reported on 06/09/2014 05/27/14   Charlesetta Shanks, MD  ondansetron (ZOFRAN ODT) 4 MG disintegrating tablet 4mg  ODT q4 hours prn nausea/vomit 06/09/14   Mariea Clonts, MD  traMADol (ULTRAM) 50 MG tablet Take 1 tablet (50 mg total) by mouth every 6 (six) hours as needed. Patient not taking: Reported on 06/09/2014 05/27/14   Charlesetta Shanks, MD   BP 122/68 mmHg  Pulse 99  Temp(Src)  98.5 F (36.9 C) (Oral)  Resp 20  SpO2 100%  LMP 05/21/2014 Physical Exam  Constitutional: She is oriented to person, place, and time. She appears well-developed and well-nourished.  HENT:  Head: Normocephalic and atraumatic.  Mild dry mm   Eyes: Conjunctivae are normal. Right eye exhibits no discharge. Left eye exhibits no discharge.  Neck: Normal range of motion. Neck supple. No tracheal deviation present.  Cardiovascular: Normal rate, regular rhythm and intact distal pulses.   Pulmonary/Chest: Effort normal and breath sounds normal.  Abdominal: Soft. She exhibits no distension. There is tenderness (periumbilical mild to moderate no guarding). There is no guarding.  Musculoskeletal: She exhibits no edema or tenderness.  Neurological: She is alert and oriented to person, place, and time. No cranial nerve deficit. GCS eye subscore is 4. GCS verbal subscore is 5. GCS motor subscore is 6.      Skin: Skin is warm. No rash noted.  Psychiatric: She has a normal mood and affect.  Nursing note and vitals reviewed.   ED Course  Procedures (including critical care time) Labs Review Labs Reviewed  CBC WITH DIFFERENTIAL - Abnormal; Notable for the following:    RBC 3.86 (*)    Hemoglobin 11.1 (*)    HCT 32.3 (*)    All other components within normal limits  COMPREHENSIVE METABOLIC PANEL  TROPONIN I  LIPASE, BLOOD    Imaging Review Ct Abdomen Pelvis W Contrast  06/09/2014   CLINICAL DATA:  Initial encounter for intermittent mid abdominal pain radiating into the chest off and on for 4 weeks  EXAM: CT ABDOMEN AND PELVIS WITH CONTRAST  TECHNIQUE: Multidetector CT imaging of the abdomen and pelvis was performed using the standard protocol following bolus administration of intravenous contrast.  CONTRAST:  93mL OMNIPAQUE IOHEXOL 300 MG/ML SOLN, 94mL OMNIPAQUE IOHEXOL 300 MG/ML SOLN  COMPARISON:  None.  FINDINGS: Lower chest:  Unremarkable  Hepatobiliary: No focal abnormality within the liver  parenchyma. There is no evidence for gallstones, gallbladder wall thickening, or pericholecystic fluid. No intrahepatic or extrahepatic biliary dilation.  Pancreas: No focal mass lesion. No dilatation of the main duct. No intraparenchymal cyst. No peripancreatic edema.  Spleen: No splenomegaly. No focal mass lesion.  Adrenals/Urinary Tract: No adrenal nodule or mass. Tiny right renal cortical cyst noted. Left kidney is normal. No evidence for hydroureter. Urinary bladder is decompressed but otherwise unremarkable.  Stomach/Bowel: Stomach is nondistended. No gastric wall thickening. No evidence of outlet obstruction. Duodenum is normally positioned as is the ligament of Treitz. No small bowel wall thickening. No small bowel dilatation. Terminal ileum is normal. The appendix is normal. No gross colonic mass. No colonic wall thickening. No substantial diverticular change.  Vascular/Lymphatic: No abdominal aortic aneurysm. No gastrohepatic or hepatoduodenal ligament lymphadenopathy. No retroperitoneal lymphadenopathy. There is no pelvic sidewall lymphadenopathy.  Reproductive: Fibroid changes are noted in the uterus. No left adnexal mass. Right adnexal fullness likely related to the right ovary measuring 4.1 x  3.5 x 4.4 cm. No definite right adnexal mass.  Other: No free intraperitoneal fluid.  Musculoskeletal: Bone windows reveal no worrisome lytic or sclerotic osseous lesions.  IMPRESSION: 1. No acute findings in the abdomen or pelvis. 2. Upper normal uterine size may be secondary to the associated fibroids. Endometrial stripe also appears somewhat thickened and irregular. Pelvic ultrasound may prove helpful to further evaluate an ultrasound could also further assess the right adnexal region.   Electronically Signed   By: Misty Stanley M.D.   On: 06/09/2014 11:58     EKG Interpretation None     EKG reviewed heart rate 98, no acute ST elevation, normal QT, sinus. MDM   Final diagnoses:  Central abdominal  pain  Nausea vomiting and diarrhea  Atypical chest pain  Patient presents with primary complaint of central bowel pain, occasional radiation to upper chest, no active chest pain this time. Plan for cardiac screen and abdominal CT for further details of pain. Patient does not appear septic, very low suspicion for dissection as majority of her pain is abdominal with soft stools and intermittent vomiting.   Patient's symptoms improved in the ER. CT results unremarkable mild thickening uterine stripe, discussed outpatient follow-up for ultrasound to primary doctor or OB/GYN. Blood work reviewed unremarkable.  Results and differential diagnosis were discussed with the patient/parent/guardian. Close follow up outpatient was discussed, comfortable with the plan.   Medications  morphine 4 MG/ML injection 4 mg (4 mg Intravenous Given 06/09/14 1206)  sodium chloride 0.9 % bolus 1,000 mL (1,000 mLs Intravenous New Bag/Given 06/09/14 1205)  iohexol (OMNIPAQUE) 300 MG/ML solution 50 mL (50 mLs Oral Contrast Given 06/09/14 1144)  iohexol (OMNIPAQUE) 300 MG/ML solution 80 mL (80 mLs Intravenous Contrast Given 06/09/14 1137)    Filed Vitals:   06/09/14 0926  BP: 122/68  Pulse: 99  Temp: 98.5 F (36.9 C)  TempSrc: Oral  Resp: 20  SpO2: 100%    Final diagnoses:  Central abdominal pain  Nausea vomiting and diarrhea       Mariea Clonts, MD 06/09/14 1324

## 2014-06-09 NOTE — ED Notes (Addendum)
Pt c/o intermittent umbilical pain radiating into chest and intermittent nausea x 4 weeks and a couple episodes of loose stools yesterday.  Pain score 8/10.  Pt reports being seen recently for chest pain.  Sts pain feels like cramping or contractions.

## 2014-06-09 NOTE — Discharge Instructions (Signed)
If you were given medicines take as directed.  If you are on coumadin or contraceptives realize their levels and effectiveness is altered by many different medicines.  If you have any reaction (rash, tongues swelling, other) to the medicines stop taking and see a physician.   Please follow up as directed and return to the ER or see a physician for new or worsening symptoms.  Thank you. Filed Vitals:   06/09/14 0926  BP: 122/68  Pulse: 99  Temp: 98.5 F (36.9 C)  TempSrc: Oral  Resp: 20  SpO2: 100%

## 2014-06-09 NOTE — ED Notes (Signed)
Pt taken to CT before arriving to room, will draw labs and give meds when she is in room.

## 2014-06-09 NOTE — ED Notes (Signed)
Unable to get blood for labs x 1

## 2014-06-14 DIAGNOSIS — R946 Abnormal results of thyroid function studies: Secondary | ICD-10-CM | POA: Diagnosis not present

## 2014-06-14 DIAGNOSIS — R079 Chest pain, unspecified: Secondary | ICD-10-CM | POA: Diagnosis not present

## 2014-06-14 DIAGNOSIS — Z0000 Encounter for general adult medical examination without abnormal findings: Secondary | ICD-10-CM | POA: Diagnosis not present

## 2014-06-14 DIAGNOSIS — K640 First degree hemorrhoids: Secondary | ICD-10-CM | POA: Diagnosis not present

## 2014-06-14 DIAGNOSIS — I10 Essential (primary) hypertension: Secondary | ICD-10-CM | POA: Diagnosis not present

## 2014-06-14 DIAGNOSIS — J45909 Unspecified asthma, uncomplicated: Secondary | ICD-10-CM | POA: Diagnosis not present

## 2014-06-14 DIAGNOSIS — K30 Functional dyspepsia: Secondary | ICD-10-CM | POA: Diagnosis not present

## 2014-06-14 DIAGNOSIS — E538 Deficiency of other specified B group vitamins: Secondary | ICD-10-CM | POA: Diagnosis not present

## 2014-06-14 DIAGNOSIS — D860 Sarcoidosis of lung: Secondary | ICD-10-CM | POA: Diagnosis not present

## 2014-06-14 DIAGNOSIS — E559 Vitamin D deficiency, unspecified: Secondary | ICD-10-CM | POA: Diagnosis not present

## 2014-06-23 ENCOUNTER — Encounter: Payer: Self-pay | Admitting: Obstetrics

## 2014-06-23 ENCOUNTER — Ambulatory Visit (INDEPENDENT_AMBULATORY_CARE_PROVIDER_SITE_OTHER): Payer: Medicare Other | Admitting: Obstetrics

## 2014-06-23 VITALS — BP 124/77 | HR 86 | Temp 97.0°F | Ht 63.0 in | Wt 110.0 lb

## 2014-06-23 DIAGNOSIS — R934 Abnormal findings on diagnostic imaging of urinary organs: Secondary | ICD-10-CM | POA: Diagnosis not present

## 2014-06-23 DIAGNOSIS — R102 Pelvic and perineal pain: Secondary | ICD-10-CM

## 2014-06-23 DIAGNOSIS — R9389 Abnormal findings on diagnostic imaging of other specified body structures: Secondary | ICD-10-CM

## 2014-06-23 MED ORDER — OXYCODONE HCL 10 MG PO TABS: 10.0000 mg | ORAL_TABLET | Freq: Four times a day (QID) | ORAL | Status: DC | PRN

## 2014-06-23 NOTE — Progress Notes (Signed)
Patient ID: Rebecca Sanchez, female   DOB: [birth year], 44 y.o.   MRN: [id number]  Chief Complaint  Patient presents with  . Follow-up    Hospital follow up abd. pain.     HPI Rebecca Sanchez is a 44 y.o. female.  Pelvic pain.  Had associated diarrhea and some chest pain.  Has ? H/O MVP.  CT scan done and showed small intramural fibroids and ? Endometrial thickening.  Patient has normal, regular, 3 day periods.  HPI   Past Medical History  Diagnosis Date  . Chronic bronchitis   . Sarcoid   . HTN (hypertension)   . Asthma   . Thyromegaly   . Chest pain     a. 04/2012 Echo: EF 65%, nl wall motion.  . Anemia   . GERD (gastroesophageal reflux disease)   . Heart murmur     Past Surgical History  Procedure Laterality Date  . Hernia repair    . Bunionectomy      Family History  Problem Relation Age of Onset  . Cancer Maternal Grandmother     lung  . Cancer Paternal Grandmother     lung  . Hypertension Mother   . Hypertension Father     Social History History  Substance Use Topics  . Smoking status: Never Smoker   . Smokeless tobacco: Never Used  . Alcohol Use: No    Allergies  Allergen Reactions  . Azithromycin Shortness Of Breath  . Motrin [Ibuprofen] Shortness Of Breath  . Peanut-Containing Drug Products Anaphylaxis    Current Outpatient Prescriptions  Medication Sig Dispense Refill  . albuterol (PROAIR HFA) 108 (90 BASE) MCG/ACT inhaler Inhale 2 puffs into the lungs every 6 (six) hours as needed for wheezing or shortness of breath.     Marland Kitchen alum & mag hydroxide-simeth (MAALOX/MYLANTA) 200-200-20 MG/5ML suspension Take 15 mLs by mouth every 6 (six) hours as needed for indigestion or heartburn.    . propranolol (INDERAL) 20 MG tablet Take 20 mg by mouth 2 (two) times daily.    Marland Kitchen aspirin EC 81 MG tablet Take 81 mg by mouth daily as needed for mild pain.     . famotidine (PEPCID) 20 MG tablet Take 1 tablet (20 mg total) by mouth 2 (two) times daily. (Patient not  taking: Reported on 06/09/2014) 28 tablet 0  . Oxycodone HCl 10 MG TABS Take 1 tablet (10 mg total) by mouth every 6 (six) hours as needed. 40 tablet 0   No current facility-administered medications for this visit.    Review of Systems Review of Systems Constitutional: negative for fatigue and weight loss Respiratory: negative for cough and wheezing Cardiovascular: negative for chest pain, fatigue and palpitations Gastrointestinal: positive for abdominal pain and change in bowel habits Genitourinary: pelvic pain Integument/breast: negative for nipple discharge Musculoskeletal:negative for myalgias Neurological: negative for gait problems and tremors Behavioral/Psych: negative for abusive relationship, depression Endocrine: negative for temperature intolerance     Blood pressure 124/77, pulse 86, temperature 97 F (36.1 C), height 5\' 3"  (1.6 m), weight 110 lb (49.896 kg), last menstrual period 06/16/2014.  Physical Exam Physical Exam:  Deferred  100% of 10 min visit spent on counseling and coordination of care.   Data Reviewed Labs   Assessment    Pelvic pain Small intramural fibroids Endometrial thickening on CT Scan    Plan  Ultrasound ordered. F/U in 2 weeks for results and Endometrial Biopsy    Orders Placed This Encounter  Procedures  . US OB Transvaginal  Standing Status: Future     Number of Occurrences:      Standing Expiration Date: 08/22/2015    Order Specific Question:  Reason for Exam (SYMPTOM  OR DIAGNOSIS REQUIRED)    Answer:  pelvic pain    Order Specific Question:  Preferred imaging location?    Answer:  [facility]  . US Transvaginal Non-OB    Standing Status: Future     Number of Occurrences:      Standing Expiration Date: 08/22/2015    Order Specific Question:  Reason for Exam (SYMPTOM  OR DIAGNOSIS REQUIRED)    Answer:  pelvic pain    Order Specific Question:  Preferred imaging location?    Answer:  [facility]   Meds ordered  this encounter  Medications  . propranolol (INDERAL) 20 MG tablet    Sig: Take 20 mg by mouth 2 (two) times daily.  . Oxycodone HCl 10 MG TABS    Sig: Take 1 tablet (10 mg total) by mouth every 6 (six) hours as needed.    Dispense:  40 tablet    Refill:  0

## 2014-06-28 ENCOUNTER — Ambulatory Visit (HOSPITAL_COMMUNITY)
Admission: RE | Admit: 2014-06-28 | Discharge: 2014-06-28 | Disposition: A | Discharge status: Home / Self Care | Payer: Medicare Other | Source: Ambulatory Visit | Attending: Obstetrics | Admitting: Obstetrics

## 2014-06-28 ENCOUNTER — Other Ambulatory Visit: Payer: Self-pay | Admitting: Obstetrics

## 2014-06-28 DIAGNOSIS — D251 Intramural leiomyoma of uterus: Secondary | ICD-10-CM | POA: Insufficient documentation

## 2014-06-28 DIAGNOSIS — D252 Subserosal leiomyoma of uterus: Secondary | ICD-10-CM | POA: Diagnosis not present

## 2014-06-28 DIAGNOSIS — N8320 Unspecified ovarian cysts: Secondary | ICD-10-CM | POA: Insufficient documentation

## 2014-06-28 DIAGNOSIS — R102 Pelvic and perineal pain: Secondary | ICD-10-CM

## 2014-06-28 DIAGNOSIS — N949 Unspecified condition associated with female genital organs and menstrual cycle: Secondary | ICD-10-CM | POA: Insufficient documentation

## 2014-06-28 DIAGNOSIS — D259 Leiomyoma of uterus, unspecified: Secondary | ICD-10-CM | POA: Diagnosis not present

## 2014-06-28 DIAGNOSIS — N8329 Other ovarian cysts: Secondary | ICD-10-CM | POA: Diagnosis not present

## 2014-06-30 DIAGNOSIS — J45909 Unspecified asthma, uncomplicated: Secondary | ICD-10-CM | POA: Diagnosis not present

## 2014-06-30 DIAGNOSIS — R946 Abnormal results of thyroid function studies: Secondary | ICD-10-CM | POA: Diagnosis not present

## 2014-06-30 DIAGNOSIS — Z0000 Encounter for general adult medical examination without abnormal findings: Secondary | ICD-10-CM | POA: Diagnosis not present

## 2014-06-30 DIAGNOSIS — I10 Essential (primary) hypertension: Secondary | ICD-10-CM | POA: Diagnosis not present

## 2014-06-30 DIAGNOSIS — E538 Deficiency of other specified B group vitamins: Secondary | ICD-10-CM | POA: Diagnosis not present

## 2014-06-30 DIAGNOSIS — D860 Sarcoidosis of lung: Secondary | ICD-10-CM | POA: Diagnosis not present

## 2014-06-30 DIAGNOSIS — M545 Low back pain: Secondary | ICD-10-CM | POA: Diagnosis not present

## 2014-06-30 DIAGNOSIS — K30 Functional dyspepsia: Secondary | ICD-10-CM | POA: Diagnosis not present

## 2014-06-30 DIAGNOSIS — E559 Vitamin D deficiency, unspecified: Secondary | ICD-10-CM | POA: Diagnosis not present

## 2014-07-11 ENCOUNTER — Ambulatory Visit: Payer: Medicare Other | Admitting: Obstetrics

## 2014-07-12 ENCOUNTER — Other Ambulatory Visit: Payer: Self-pay | Admitting: Internal Medicine

## 2014-07-12 DIAGNOSIS — E0590 Thyrotoxicosis, unspecified without thyrotoxic crisis or storm: Secondary | ICD-10-CM | POA: Diagnosis not present

## 2014-07-12 DIAGNOSIS — E0500 Thyrotoxicosis with diffuse goiter without thyrotoxic crisis or storm: Secondary | ICD-10-CM | POA: Diagnosis not present

## 2014-07-13 DIAGNOSIS — R0789 Other chest pain: Secondary | ICD-10-CM | POA: Diagnosis not present

## 2014-07-13 DIAGNOSIS — E0590 Thyrotoxicosis, unspecified without thyrotoxic crisis or storm: Secondary | ICD-10-CM | POA: Diagnosis not present

## 2014-07-13 DIAGNOSIS — D860 Sarcoidosis of lung: Secondary | ICD-10-CM | POA: Diagnosis not present

## 2014-07-13 DIAGNOSIS — R0602 Shortness of breath: Secondary | ICD-10-CM | POA: Diagnosis not present

## 2014-07-18 ENCOUNTER — Encounter: Payer: Self-pay | Admitting: Obstetrics

## 2014-07-18 ENCOUNTER — Ambulatory Visit (INDEPENDENT_AMBULATORY_CARE_PROVIDER_SITE_OTHER): Payer: Medicare Other | Admitting: Obstetrics

## 2014-07-18 VITALS — BP 117/78 | HR 90 | Temp 98.7°F | Ht 62.0 in | Wt 109.0 lb

## 2014-07-18 DIAGNOSIS — R102 Pelvic and perineal pain: Secondary | ICD-10-CM | POA: Diagnosis not present

## 2014-07-18 DIAGNOSIS — R934 Abnormal findings on diagnostic imaging of urinary organs: Secondary | ICD-10-CM

## 2014-07-18 DIAGNOSIS — E0590 Thyrotoxicosis, unspecified without thyrotoxic crisis or storm: Secondary | ICD-10-CM

## 2014-07-18 DIAGNOSIS — R9389 Abnormal findings on diagnostic imaging of other specified body structures: Secondary | ICD-10-CM

## 2014-07-18 MED ORDER — AMOXICILLIN 500 MG PO CAPS: 2000.0000 mg | ORAL_CAPSULE | Freq: Once | ORAL | Status: DC

## 2014-07-18 NOTE — Progress Notes (Addendum)
Patient ID: Rebecca Sanchez, female   DOB: May 24, 1971, 44 y.o.   MRN: [id number]  Chief Complaint  Patient presents with  . Follow-up    Abdominal Pain     HPI Rebecca Sanchez is a 44 y.o. female.  H/O pelvic pain over past month.  CT scan revealed endometrial thickening.  Ultrasound done and agrees.  Menstrual cycles are regular.  No intermenstrual bleeding and periods are 4-5 days, sometimes with clotting but not too heavy.  Recently diagnosed with Hyperthyroidism and is on Methimazole.  HPI  Past Medical History  Diagnosis Date  . Chronic bronchitis   . Sarcoid   . HTN (hypertension)   . Asthma   . Thyromegaly   . Chest pain     a. 04/2012 Echo: EF 65%, nl wall motion.  . Anemia   . GERD (gastroesophageal reflux disease)   . Heart murmur     Past Surgical History  Procedure Laterality Date  . Hernia repair    . Bunionectomy      Family History  Problem Relation Age of Onset  . Cancer Maternal Grandmother     lung  . Cancer Paternal Grandmother     lung  . Hypertension Mother   . Hypertension Father     Social History History  Substance Use Topics  . Smoking status: Never Smoker   . Smokeless tobacco: Never Used  . Alcohol Use: No    Allergies  Allergen Reactions  . Azithromycin Shortness Of Breath  . Motrin [Ibuprofen] Shortness Of Breath  . Peanut-Containing Drug Products Anaphylaxis    Current Outpatient Prescriptions  Medication Sig Dispense Refill  . albuterol (PROAIR HFA) 108 (90 BASE) MCG/ACT inhaler Inhale 2 puffs into the lungs every 6 (six) hours as needed for wheezing or shortness of breath.     . methimazole (TAPAZOLE) 10 MG tablet Take 10 mg by mouth 2 (two) times daily.    . propranolol (INDERAL) 20 MG tablet Take 20 mg by mouth 2 (two) times daily.    Marland Kitchen alum & mag hydroxide-simeth (MAALOX/MYLANTA) 200-200-20 MG/5ML suspension Take 15 mLs by mouth every 6 (six) hours as needed for indigestion or heartburn.    Marland Kitchen amoxicillin (AMOXIL) 500 MG  capsule Take 4 capsules (2,000 mg total) by mouth once. 15 capsule 0  . aspirin EC 81 MG tablet Take 81 mg by mouth daily as needed for mild pain.     . famotidine (PEPCID) 20 MG tablet Take 1 tablet (20 mg total) by mouth 2 (two) times daily. (Patient not taking: Reported on 06/09/2014) 28 tablet 0  . Oxycodone HCl 10 MG TABS Take 1 tablet (10 mg total) by mouth every 6 (six) hours as needed. (Patient not taking: Reported on 07/18/2014) 40 tablet 0   No current facility-administered medications for this visit.    Review of Systems Review of Systems Constitutional: negative for fatigue and weight loss Respiratory: negative for cough and wheezing Cardiovascular: negative for chest pain, fatigue and palpitations Gastrointestinal: negative for abdominal pain and change in bowel habits Genitourinary: positive for pelvic pain Integument/breast: negative for nipple discharge Musculoskeletal:negative for myalgias Neurological: negative for gait problems and tremors Behavioral/Psych: negative for abusive relationship, depression Endocrine: negative for temperature intolerance     Blood pressure 117/78, pulse 90, temperature 98.7 F (37.1 C), height 5\' 2"  (1.575 m), weight 109 lb (49.442 kg), last menstrual period 06/16/2014.  Physical Exam Physical Exam:  Deferred  100% of 10 min visit spent on counseling and coordination of  care.   Data Reviewed CT Ultrasound  Assessment     Endometrial thickening on IU/S and CT of pelvis.     Plan    Return for endometrial biopsy after next period.   No orders of the defined types were placed in this encounter.   Meds ordered this encounter  Medications  . methimazole (TAPAZOLE) 10 MG tablet    Sig: Take 10 mg by mouth 2 (two) times daily.  Marland Kitchen amoxicillin (AMOXIL) 500 MG capsule    Sig: Take 4 capsules (2,000 mg total) by mouth once.    Dispense:  15 capsule    Refill:  0

## 2014-07-23 ENCOUNTER — Emergency Department (HOSPITAL_COMMUNITY): Payer: Medicare Other

## 2014-07-23 ENCOUNTER — Emergency Department (HOSPITAL_COMMUNITY)
Admission: EM | Admit: 2014-07-23 | Discharge: 2014-07-23 | Disposition: A | Discharge status: Home / Self Care | Payer: Medicare Other | Attending: Emergency Medicine | Admitting: Emergency Medicine

## 2014-07-23 ENCOUNTER — Encounter (HOSPITAL_COMMUNITY): Payer: Self-pay | Admitting: Emergency Medicine

## 2014-07-23 DIAGNOSIS — Y9289 Other specified places as the place of occurrence of the external cause: Secondary | ICD-10-CM | POA: Diagnosis not present

## 2014-07-23 DIAGNOSIS — Z862 Personal history of diseases of the blood and blood-forming organs and certain disorders involving the immune mechanism: Secondary | ICD-10-CM | POA: Diagnosis not present

## 2014-07-23 DIAGNOSIS — X58XXXA Exposure to other specified factors, initial encounter: Secondary | ICD-10-CM | POA: Diagnosis not present

## 2014-07-23 DIAGNOSIS — K219 Gastro-esophageal reflux disease without esophagitis: Secondary | ICD-10-CM | POA: Insufficient documentation

## 2014-07-23 DIAGNOSIS — I10 Essential (primary) hypertension: Secondary | ICD-10-CM | POA: Insufficient documentation

## 2014-07-23 DIAGNOSIS — T189XXA Foreign body of alimentary tract, part unspecified, initial encounter: Secondary | ICD-10-CM | POA: Diagnosis not present

## 2014-07-23 DIAGNOSIS — Y998 Other external cause status: Secondary | ICD-10-CM | POA: Diagnosis not present

## 2014-07-23 DIAGNOSIS — Z79899 Other long term (current) drug therapy: Secondary | ICD-10-CM | POA: Insufficient documentation

## 2014-07-23 DIAGNOSIS — Z0389 Encounter for observation for other suspected diseases and conditions ruled out: Secondary | ICD-10-CM | POA: Diagnosis not present

## 2014-07-23 DIAGNOSIS — J45909 Unspecified asthma, uncomplicated: Secondary | ICD-10-CM | POA: Diagnosis not present

## 2014-07-23 DIAGNOSIS — E049 Nontoxic goiter, unspecified: Secondary | ICD-10-CM | POA: Diagnosis not present

## 2014-07-23 DIAGNOSIS — Y9389 Activity, other specified: Secondary | ICD-10-CM | POA: Diagnosis not present

## 2014-07-23 DIAGNOSIS — R011 Cardiac murmur, unspecified: Secondary | ICD-10-CM | POA: Diagnosis not present

## 2014-07-23 NOTE — Discharge Instructions (Signed)
Pt advised to continue monitoring for new or worsening symptoms. Please return if you experience difficulty breathing, swallowing, drooling, nausea, vomiting, or abdominal pain. Follow-up with your primary care provider in 2 days if symptoms persist.

## 2014-07-23 NOTE — ED Provider Notes (Signed)
CSN: [id number]     Arrival date & time 07/23/14  2026 History   First MD Initiated Contact with Patient 07/23/14 2219     Chief Complaint  Patient presents with  . Swallowed Foreign Body     HPI Comments: 42 YOF presents after swallowing a chicken bone this evening. She describes the pain as sharp, localized to right lateral neck with no radiation of symptoms. Denies difficulty breathing, swallowing, coughing, upset stomach, nausea/vomtting. She attempted attempted to pass the bone down by eating bread and drinking copious amounts of water. She states the pain persisted but has slowly started to resolve.    Past Medical History  Diagnosis Date  . Chronic bronchitis   . Sarcoid   . HTN (hypertension)   . Asthma   . Thyromegaly   . Chest pain     a. 04/2012 Echo: EF 65%, nl wall motion.  . Anemia   . GERD (gastroesophageal reflux disease)   . Heart murmur    Past Surgical History  Procedure Laterality Date  . Hernia repair    . Bunionectomy     Family History  Problem Relation Age of Onset  . Cancer Maternal Grandmother     lung  . Cancer Paternal Grandmother     lung  . Hypertension Mother   . Hypertension Father    History  Substance Use Topics  . Smoking status: Never Smoker   . Smokeless tobacco: Never Used  . Alcohol Use: No   OB History    Gravida Para Term Preterm AB TAB SAB Ectopic Multiple Living   4 3 3  1  1         Review of Systems  All other systems reviewed and are negative.   Allergies  Azithromycin; Motrin; and Peanut-containing drug products  Home Medications   Prior to Admission medications   Medication Sig Start Date End Date Taking? Authorizing Provider  albuterol (PROAIR HFA) 108 (90 BASE) MCG/ACT inhaler Inhale 2 puffs into the lungs every 6 (six) hours as needed for wheezing or shortness of breath.    Yes Historical Provider, MD  methimazole (TAPAZOLE) 10 MG tablet Take 20 mg by mouth 2 (two) times daily.    Yes Historical Provider,  MD  propranolol (INDERAL) 20 MG tablet Take 20 mg by mouth 2 (two) times daily.   Yes Historical Provider, MD  amoxicillin (AMOXIL) 500 MG capsule Take 4 capsules (2,000 mg total) by mouth once. Patient not taking: Reported on 07/23/2014 07/18/14   Shelly Bombard, MD  famotidine (PEPCID) 20 MG tablet Take 1 tablet (20 mg total) by mouth 2 (two) times daily. Patient not taking: Reported on 06/09/2014 05/27/14   Charlesetta Shanks, MD  Oxycodone HCl 10 MG TABS Take 1 tablet (10 mg total) by mouth every 6 (six) hours as needed. Patient not taking: Reported on 07/18/2014 06/23/14   Shelly Bombard, MD   BP 161/80 mmHg  Pulse 77  Temp(Src) 97.9 F (36.6 C) (Oral)  SpO2 100%  LMP 07/14/2014 Physical Exam  Constitutional: She is oriented to person, place, and time. She appears well-developed and well-nourished.  HENT:  Head: Normocephalic and atraumatic.  Eyes: Conjunctivae are normal. Pupils are equal, round, and reactive to light. Right eye exhibits no discharge. Left eye exhibits no discharge. No scleral icterus.  Neck: Normal range of motion and full passive range of motion without pain. Neck supple. No JVD present. No tracheal tenderness and no muscular tenderness present. No rigidity. No tracheal  deviation and normal range of motion present. No thyroid mass present.  Cardiovascular: Normal rate, regular rhythm, S1 normal and S2 normal.   Pulmonary/Chest: Effort normal. No stridor.  Neurological: She is alert and oriented to person, place, and time. Coordination normal.  Psychiatric: She has a normal mood and affect. Her behavior is normal. Judgment and thought content normal.  Nursing note and vitals reviewed.   ED Course  Procedures (including critical care time) Labs Review Labs Reviewed - No data to display  Imaging Review Dg Neck Soft Tissue  07/23/2014   CLINICAL DATA:  Possible ingested chicken bone  EXAM: NECK SOFT TISSUES - 1+ VIEW  COMPARISON:  None.  FINDINGS: There is no evidence  of retropharyngeal soft tissue swelling or epiglottic enlargement. The cervical airway is unremarkable and no radio-opaque foreign body identified.  IMPRESSION: No acute abnormality seen.   Electronically Signed   By: Inez Catalina M.D.   On: 07/23/2014 20:52     EKG Interpretation None      MDM   Final diagnoses:  Swallowed foreign body, initial encounter   Pt's story consistent with swallowed foreign body. No difficulty breathing, swallowing, or speaking. Neck and soft tissues x-rays show no signs of foreign body. Pt advised to return if new or worsening symptoms present. Instructed to follow up with PCP for re-check in 3 days.     Stevie Kern Manda Holstad, PA-C 07/23/14 5300  Dorie Rank, MD 07/24/14 [phone]

## 2014-07-23 NOTE — ED Notes (Signed)
Patient upset r/t delay in discharge Patient informed that this nurse had been upstairs with an admission--patient still visibly upset This nurse asked if there was anything that the ED staff could do to make her visit more pleasant and offered to have ED Charge nurse speak with her--patient declined and left room ED Charge aware

## 2014-07-23 NOTE — ED Notes (Signed)
Patient ambulatory from waiting room  Patient with c/o feeling like something is stuck in her throat after eating Patient able to speak in full complete sentences without difficulty--handles secretions RR WNL--even and unlabored  LCTA bilaterally  Patient alert and oriented x 4 and in NAD

## 2014-07-23 NOTE — ED Notes (Signed)
Pt states she swallowed possible bone while eating chicken pie. NAD, pt able to eat and drink but can feel bone in throat.

## 2014-07-27 ENCOUNTER — Telehealth: Payer: Self-pay | Admitting: *Deleted

## 2014-07-27 ENCOUNTER — Ambulatory Visit (HOSPITAL_COMMUNITY): Admission: RE | Admit: 2014-07-27 | Payer: Medicare Other | Source: Ambulatory Visit

## 2014-07-27 ENCOUNTER — Ambulatory Visit (HOSPITAL_COMMUNITY): Payer: Medicare Other

## 2014-07-27 DIAGNOSIS — J45909 Unspecified asthma, uncomplicated: Secondary | ICD-10-CM | POA: Diagnosis not present

## 2014-07-27 DIAGNOSIS — D860 Sarcoidosis of lung: Secondary | ICD-10-CM | POA: Diagnosis not present

## 2014-07-27 DIAGNOSIS — J209 Acute bronchitis, unspecified: Secondary | ICD-10-CM | POA: Diagnosis not present

## 2014-07-27 DIAGNOSIS — E538 Deficiency of other specified B group vitamins: Secondary | ICD-10-CM | POA: Diagnosis not present

## 2014-07-27 DIAGNOSIS — E559 Vitamin D deficiency, unspecified: Secondary | ICD-10-CM | POA: Diagnosis not present

## 2014-07-27 DIAGNOSIS — M545 Low back pain: Secondary | ICD-10-CM | POA: Diagnosis not present

## 2014-07-27 DIAGNOSIS — I10 Essential (primary) hypertension: Secondary | ICD-10-CM | POA: Diagnosis not present

## 2014-07-27 DIAGNOSIS — K30 Functional dyspepsia: Secondary | ICD-10-CM | POA: Diagnosis not present

## 2014-07-27 NOTE — Telephone Encounter (Signed)
Attempted to contact patient to schedule an endometrial biopsy ordered by Dr. Jodi Mourning. Patient was instructed by Dr. Jodi Mourning on 07-18-14 to call the office when her next cycle starts. Biopsy to be scheduled after her period. Patient has not contact the office. Left a message for patient to call the office.

## 2014-07-28 ENCOUNTER — Encounter (HOSPITAL_COMMUNITY): Payer: Medicare Other

## 2014-08-01 NOTE — Telephone Encounter (Signed)
Contacted patient and she states she has not had a cycle. Patient states she will call on the first day of her cycle to schedule her procedure.

## 2014-08-07 ENCOUNTER — Emergency Department (HOSPITAL_COMMUNITY)
Admission: EM | Admit: 2014-08-07 | Discharge: 2014-08-07 | Disposition: A | Discharge status: Home / Self Care | Payer: Medicare Other | Attending: Emergency Medicine | Admitting: Emergency Medicine

## 2014-08-07 ENCOUNTER — Encounter (HOSPITAL_COMMUNITY): Payer: Self-pay | Admitting: Emergency Medicine

## 2014-08-07 DIAGNOSIS — Z7951 Long term (current) use of inhaled steroids: Secondary | ICD-10-CM | POA: Diagnosis not present

## 2014-08-07 DIAGNOSIS — R011 Cardiac murmur, unspecified: Secondary | ICD-10-CM | POA: Insufficient documentation

## 2014-08-07 DIAGNOSIS — Z862 Personal history of diseases of the blood and blood-forming organs and certain disorders involving the immune mechanism: Secondary | ICD-10-CM | POA: Diagnosis not present

## 2014-08-07 DIAGNOSIS — Z79899 Other long term (current) drug therapy: Secondary | ICD-10-CM | POA: Insufficient documentation

## 2014-08-07 DIAGNOSIS — J45901 Unspecified asthma with (acute) exacerbation: Secondary | ICD-10-CM | POA: Diagnosis not present

## 2014-08-07 DIAGNOSIS — R0602 Shortness of breath: Secondary | ICD-10-CM | POA: Diagnosis not present

## 2014-08-07 DIAGNOSIS — K219 Gastro-esophageal reflux disease without esophagitis: Secondary | ICD-10-CM | POA: Diagnosis not present

## 2014-08-07 DIAGNOSIS — E049 Nontoxic goiter, unspecified: Secondary | ICD-10-CM | POA: Diagnosis not present

## 2014-08-07 DIAGNOSIS — I10 Essential (primary) hypertension: Secondary | ICD-10-CM | POA: Diagnosis not present

## 2014-08-07 DIAGNOSIS — R1319 Other dysphagia: Secondary | ICD-10-CM | POA: Diagnosis not present

## 2014-08-07 DIAGNOSIS — R1310 Dysphagia, unspecified: Secondary | ICD-10-CM | POA: Diagnosis not present

## 2014-08-07 MED ORDER — DIPHENHYDRAMINE HCL 25 MG PO TABS: 25.0000 mg | ORAL_TABLET | Freq: Four times a day (QID) | ORAL | Status: DC

## 2014-08-07 MED ORDER — PREDNISONE (PAK) 10 MG PO TABS: ORAL_TABLET | Freq: Four times a day (QID) | ORAL | Status: DC

## 2014-08-07 MED ORDER — DIPHENHYDRAMINE HCL 25 MG PO CAPS
25.0000 mg | ORAL_CAPSULE | Freq: Once | ORAL | Status: DC
  Filled 2014-08-07: qty 1

## 2014-08-07 NOTE — ED Notes (Signed)
Per pt, states she has started thyroid meds on 2/17-states since starting med, feels like someone is choaking her-stopped taking med last Thursday but still having sensation

## 2014-08-07 NOTE — ED Provider Notes (Signed)
CSN: [id number]     Arrival date & time 08/07/14  1201 History   None    Chief Complaint  Patient presents with  . Medication Reaction     (Consider location/radiation/quality/duration/timing/severity/associated sxs/prior Treatment) The history is provided by the patient. No language interpreter was used.  Rebecca Sanchez is a 44 y.o black female with a history of hyperthyroidism who presents for gradual onset difficulty swallowing and shortness of breath for the past 3 days.  Rebecca Sanchez feels that Rebecca Sanchez cannot swallow as good and food is getting stuck in Rebecca Sanchez throat.  Rebecca Sanchez started propranolol on 06/10/14 and Methimazole on 07/12/14 and thinks it may be one of the two medications Rebecca Sanchez is taking. Rebecca Sanchez denies any pain or radiation. Rebecca Sanchez was seen for the same 2 weeks ago and they did an xray which was normal. Rebecca Sanchez denies any drooling, choking, sore throat, or cough.   Past Medical History  Diagnosis Date  . Chronic bronchitis   . Sarcoid   . HTN (hypertension)   . Asthma   . Thyromegaly   . Chest pain     a. 04/2012 Echo: EF 65%, nl wall motion.  . Anemia   . GERD (gastroesophageal reflux disease)   . Heart murmur    Past Surgical History  Procedure Laterality Date  . Hernia repair    . Bunionectomy     Family History  Problem Relation Age of Onset  . Cancer Maternal Grandmother     lung  . Cancer Paternal Grandmother     lung  . Hypertension Mother   . Hypertension Father    History  Substance Use Topics  . Smoking status: Never Smoker   . Smokeless tobacco: Never Used  . Alcohol Use: No   OB History    Gravida Para Term Preterm AB TAB SAB Ectopic Multiple Living   4 3 3  1  1         Review of Systems  Constitutional: Negative for fever and chills.  HENT: Positive for trouble swallowing. Negative for ear pain, mouth sores, sore throat and voice change.   Respiratory: Positive for shortness of breath. Negative for cough.   Cardiovascular: Negative for chest pain.  Gastrointestinal:  Negative for abdominal pain.  Allergic/Immunologic: Positive for food allergies.  All other systems reviewed and are negative.     Allergies  Azithromycin; Motrin; and Peanut-containing drug products  Home Medications   Prior to Admission medications   Medication Sig Start Date End Date Taking? Authorizing Provider  albuterol (PROAIR HFA) 108 (90 BASE) MCG/ACT inhaler Inhale 2 puffs into the lungs every 6 (six) hours as needed for wheezing or shortness of breath.    Yes Historical Provider, MD  fluticasone (FLONASE) 50 MCG/ACT nasal spray Place 2 sprays into both nostrils 2 (two) times daily.   Yes Historical Provider, MD  methimazole (TAPAZOLE) 10 MG tablet Take 20 mg by mouth 2 (two) times daily.    Yes Historical Provider, MD  propranolol (INDERAL) 20 MG tablet Take 10 mg by mouth 2 (two) times daily.    Yes Historical Provider, MD  amoxicillin (AMOXIL) 500 MG capsule Take 4 capsules (2,000 mg total) by mouth once. Patient not taking: Reported on 07/23/2014 07/18/14   Shelly Bombard, MD  diphenhydrAMINE (BENADRYL) 25 MG tablet Take 1 tablet (25 mg total) by mouth every 6 (six) hours. 08/07/14   Tima Curet Patel-Mills, PA-C  famotidine (PEPCID) 20 MG tablet Take 1 tablet (20 mg total) by mouth 2 (two) times  daily. Patient not taking: Reported on 06/09/2014 05/27/14   Charlesetta Shanks, MD  Oxycodone HCl 10 MG TABS Take 1 tablet (10 mg total) by mouth every 6 (six) hours as needed. Patient not taking: Reported on 07/18/2014 06/23/14   Shelly Bombard, MD  predniSONE (STERAPRED UNI-PAK) 10 MG tablet Take by mouth taper from 4 doses each day to 1 dose and stop. 08/07/14   Tallis Soledad Patel-Mills, PA-C   BP 146/73 mmHg  Pulse 73  Temp(Src) 97.7 F (36.5 C) (Oral)  Resp 16  SpO2 100%  LMP 08/07/2014 Physical Exam  Constitutional: Rebecca Sanchez is oriented to person, place, and time. Rebecca Sanchez appears well-developed and well-nourished.  HENT:  Head: Normocephalic and atraumatic.  Mouth/Throat: Uvula is midline,  oropharynx is clear and moist and mucous membranes are normal. No oropharyngeal exudate, posterior oropharyngeal edema, posterior oropharyngeal erythema or tonsillar abscesses.  Rebecca Sanchez has a goiter.   Eyes: Conjunctivae are normal.  Neck: Normal range of motion. Neck supple.  Cardiovascular: Normal rate, regular rhythm and normal heart sounds.   Pulmonary/Chest: Effort normal and breath sounds normal.  Abdominal: Soft. There is no tenderness.  Musculoskeletal: Normal range of motion.  Neurological: Rebecca Sanchez is alert and oriented to person, place, and time.  Skin: Skin is warm and dry.  Nursing note and vitals reviewed.   ED Course  Procedures (including critical care time) Labs Review Labs Reviewed - No data to display  Imaging Review No results found.   EKG Interpretation None      MDM   Final diagnoses:  Difficulty swallowing solids  Patient had an episode 2 weeks ago where Rebecca Sanchez swallowed a chicken bone. Xray of neck and soft tissue was normal.  Patient is sitting comfortably, no difficulty breathing, vital signs are normal. Rebecca Sanchez is able to tolerate fluids. Rebecca Sanchez has a goiter that Rebecca Sanchez says it is unchanged for the past 7 or 8 years.  Rebecca Sanchez is in a hurry to leave to pick up Rebecca Sanchez child. Rebecca Sanchez was instructed to discontinue Rebecca Sanchez thyroid medication until follow up with Rebecca Sanchez primary care physician.  Eat soft foods.  Discussed that a swallow study should be done outpatient and that Rebecca Sanchez can return at anytime for swelling of Rebecca Sanchez neck or throat, or difficulty breathing. Rebecca Sanchez agrees with the plan.      Ottie Glazier, PA-C 08/08/14 0007  Noemi Chapel, MD 08/08/14 [phone]

## 2014-08-07 NOTE — ED Notes (Signed)
MD MILLER at bedside.

## 2014-08-07 NOTE — Discharge Instructions (Signed)
Dysphagia Swallowing problems (dysphagia) occur when solids and liquids seem to stick in your throat on the way down to your stomach, or the food takes longer to get to the stomach. Other symptoms include regurgitating food, noises coming from the throat, chest discomfort with swallowing, and a feeling of fullness or the feeling of something being stuck in your throat when swallowing. When blockage in your throat is complete, it may be associated with drooling. CAUSES  Problems with swallowing may occur because of problems with the muscles. The food cannot be propelled in the usual manner into your stomach. You may have ulcers, scar tissue, or inflammation in the tube down which food travels from your mouth to your stomach (esophagus), which blocks food from passing normally into the stomach. Causes of inflammation include:  Acid reflux from your stomach into your esophagus.  Infection.  Radiation treatment for cancer.  Medicines taken without enough fluids to wash them down into your stomach. You may have nerve problems that prevent signals from being sent to the muscles of your esophagus to contract and move your food down to your stomach. Globus pharyngeus is a relatively common problem in which there is a sense of an obstruction or difficulty in swallowing, without any physical abnormalities of the swallowing passages being found. This problem usually improves over time with reassurance and testing to rule out other causes. DIAGNOSIS Dysphagia can be diagnosed and its cause can be determined by tests in which you swallow a white substance that helps illuminate the inside of your throat (contrast medium) while X-rays are taken. Sometimes a flexible telescope that is inserted down your throat (endoscopy) to look at your esophagus and stomach is used. TREATMENT   If the dysphagia is caused by acid reflux or infection, medicines may be used.  If the dysphagia is caused by problems with your  swallowing muscles, swallowing therapy may be used to help you strengthen your swallowing muscles.  If the dysphagia is caused by a blockage or mass, procedures to remove the blockage may be done. HOME CARE INSTRUCTIONS  Try to eat soft food that is easier to swallow and check your weight on a daily basis to be sure that it is not decreasing.  Be sure to drink liquids when sitting upright (not lying down). SEEK MEDICAL CARE IF:  You are losing weight because you are unable to swallow.  You are coughing when you drink liquids (aspiration).  You are coughing up partially digested food. SEEK IMMEDIATE MEDICAL CARE IF:  You are unable to swallow your own saliva .  You are having shortness of breath or a fever, or both.  You have a hoarse voice along with difficulty swallowing. MAKE SURE YOU:  Understand these instructions.  Will watch your condition.  Will get help right away if you are not doing well or get worse. Document Released: 05/09/2000 Document Revised: 09/26/2013 Document Reviewed: 10/29/2012 [facility] Patient Information 2015 [place], [place]. This information is not intended to replace advice given to you by your health care provider. Make sure you discuss any questions you have with your health care provider.  Follow up with your primary care physician.  You will need a swallow study.  Eat soft foods and do not take your thyroid medication until you have seen your primary care physician.

## 2014-08-07 NOTE — ED Provider Notes (Signed)
44 year old female, recently diagnosed with hyperthyroidism, presents after developing difficulty swallowing. She also had the same symptoms back at the end of February, she states that her symptoms were exactly the same, they would come on after taking her medication which was methimazole and propranolol, they would then ease off however her symptoms have been present since yesterday and has not gone away. She has been drinking liquids without difficulty but has had nothing oral by mouth that was required chewing or with solid food today. She has no difficulty breathing, she is able to swallow her own saliva, she has no chest pain coughing back pain or swelling of the legs. She had normal x-rays done 2 weeks ago when she first presented. She just started taking these medications one month ago. She states at this time that her symptoms are mild, on exam the patient has a very clear oropharynx, there is normal phonation, normal breath sounds, normal speech, tolerating secretions without any difficulty, clear heart and lung sounds. She states that after being seen in the room she has to leave immediately to go get her daughter, she does not want to stay for further treatment which would involve a upper GI swallowing study yet, potentially repeating neck radiographs, at this point we'll treat her with allergic reaction medications and have her follow-up for a swallowing study or to return should her symptoms worsen, have given her soft dietary instructions and she has agreed.  Medical screening examination/treatment/procedure(s) were conducted as a shared visit with non-physician practitioner(s) and myself.  I personally evaluated the patient during the encounter.  Clinical Impression:   Final diagnoses:  Difficulty swallowing solids         Noemi Chapel, MD 08/08/14 [phone]

## 2014-08-10 DIAGNOSIS — I10 Essential (primary) hypertension: Secondary | ICD-10-CM | POA: Diagnosis not present

## 2014-08-10 DIAGNOSIS — I119 Hypertensive heart disease without heart failure: Secondary | ICD-10-CM | POA: Diagnosis not present

## 2014-08-10 DIAGNOSIS — K30 Functional dyspepsia: Secondary | ICD-10-CM | POA: Diagnosis not present

## 2014-08-10 DIAGNOSIS — J309 Allergic rhinitis, unspecified: Secondary | ICD-10-CM | POA: Diagnosis not present

## 2014-08-10 DIAGNOSIS — K589 Irritable bowel syndrome without diarrhea: Secondary | ICD-10-CM | POA: Diagnosis not present

## 2014-08-10 DIAGNOSIS — D860 Sarcoidosis of lung: Secondary | ICD-10-CM | POA: Diagnosis not present

## 2014-08-10 DIAGNOSIS — F329 Major depressive disorder, single episode, unspecified: Secondary | ICD-10-CM | POA: Diagnosis not present

## 2014-08-10 DIAGNOSIS — K219 Gastro-esophageal reflux disease without esophagitis: Secondary | ICD-10-CM | POA: Diagnosis not present

## 2014-08-10 DIAGNOSIS — M545 Low back pain: Secondary | ICD-10-CM | POA: Diagnosis not present

## 2014-08-10 DIAGNOSIS — J45909 Unspecified asthma, uncomplicated: Secondary | ICD-10-CM | POA: Diagnosis not present

## 2014-08-10 DIAGNOSIS — E538 Deficiency of other specified B group vitamins: Secondary | ICD-10-CM | POA: Diagnosis not present

## 2014-08-10 DIAGNOSIS — E559 Vitamin D deficiency, unspecified: Secondary | ICD-10-CM | POA: Diagnosis not present

## 2014-08-10 DIAGNOSIS — R1310 Dysphagia, unspecified: Secondary | ICD-10-CM | POA: Diagnosis not present

## 2014-08-12 ENCOUNTER — Emergency Department (HOSPITAL_COMMUNITY): Payer: Medicare Other

## 2014-08-12 ENCOUNTER — Emergency Department (HOSPITAL_COMMUNITY)
Admission: EM | Admit: 2014-08-12 | Discharge: 2014-08-13 | Disposition: A | Discharge status: Home / Self Care | Payer: Medicare Other | Attending: Emergency Medicine | Admitting: Emergency Medicine

## 2014-08-12 ENCOUNTER — Encounter (HOSPITAL_COMMUNITY): Payer: Self-pay | Admitting: Emergency Medicine

## 2014-08-12 DIAGNOSIS — E0590 Thyrotoxicosis, unspecified without thyrotoxic crisis or storm: Secondary | ICD-10-CM | POA: Diagnosis not present

## 2014-08-12 DIAGNOSIS — R0989 Other specified symptoms and signs involving the circulatory and respiratory systems: Secondary | ICD-10-CM

## 2014-08-12 DIAGNOSIS — F458 Other somatoform disorders: Secondary | ICD-10-CM | POA: Diagnosis not present

## 2014-08-12 DIAGNOSIS — E049 Nontoxic goiter, unspecified: Secondary | ICD-10-CM | POA: Diagnosis not present

## 2014-08-12 DIAGNOSIS — Z79899 Other long term (current) drug therapy: Secondary | ICD-10-CM | POA: Insufficient documentation

## 2014-08-12 DIAGNOSIS — R0602 Shortness of breath: Secondary | ICD-10-CM

## 2014-08-12 DIAGNOSIS — E079 Disorder of thyroid, unspecified: Secondary | ICD-10-CM

## 2014-08-12 DIAGNOSIS — Z792 Long term (current) use of antibiotics: Secondary | ICD-10-CM | POA: Insufficient documentation

## 2014-08-12 DIAGNOSIS — Z7952 Long term (current) use of systemic steroids: Secondary | ICD-10-CM | POA: Diagnosis not present

## 2014-08-12 DIAGNOSIS — R011 Cardiac murmur, unspecified: Secondary | ICD-10-CM | POA: Diagnosis not present

## 2014-08-12 DIAGNOSIS — Z8719 Personal history of other diseases of the digestive system: Secondary | ICD-10-CM | POA: Diagnosis not present

## 2014-08-12 DIAGNOSIS — R1310 Dysphagia, unspecified: Secondary | ICD-10-CM | POA: Diagnosis not present

## 2014-08-12 DIAGNOSIS — Z862 Personal history of diseases of the blood and blood-forming organs and certain disorders involving the immune mechanism: Secondary | ICD-10-CM | POA: Diagnosis not present

## 2014-08-12 DIAGNOSIS — E041 Nontoxic single thyroid nodule: Secondary | ICD-10-CM | POA: Insufficient documentation

## 2014-08-12 DIAGNOSIS — J45909 Unspecified asthma, uncomplicated: Secondary | ICD-10-CM | POA: Diagnosis not present

## 2014-08-12 DIAGNOSIS — R079 Chest pain, unspecified: Secondary | ICD-10-CM | POA: Diagnosis not present

## 2014-08-12 LAB — CBC WITH DIFFERENTIAL/PLATELET
Basophils Absolute: 0 10*3/uL (ref 0.0–0.1)
Basophils Relative: 0 % (ref 0–1)
Eosinophils Absolute: 0.1 10*3/uL (ref 0.0–0.7)
Eosinophils Relative: 1 % (ref 0–5)
HCT: 39.4 % (ref 36.0–46.0)
Hemoglobin: 13.7 g/dL (ref 12.0–15.0)
Lymphocytes Relative: 58 % — ABNORMAL HIGH (ref 12–46)
Lymphs Abs: 4.8 10*3/uL — ABNORMAL HIGH (ref 0.7–4.0)
MCH: 28.7 pg (ref 26.0–34.0)
MCHC: 34.8 g/dL (ref 30.0–36.0)
MCV: 82.6 fL (ref 78.0–100.0)
Monocytes Absolute: 0.5 10*3/uL (ref 0.1–1.0)
Monocytes Relative: 6 % (ref 3–12)
Neutro Abs: 2.9 10*3/uL (ref 1.7–7.7)
Neutrophils Relative %: 35 % — ABNORMAL LOW (ref 43–77)
Platelets: 423 10*3/uL — ABNORMAL HIGH (ref 150–400)
RBC: 4.77 MIL/uL (ref 3.87–5.11)
RDW: 13.1 % (ref 11.5–15.5)
WBC: 8.4 10*3/uL (ref 4.0–10.5)

## 2014-08-12 LAB — BASIC METABOLIC PANEL
Anion gap: 10 (ref 5–15)
BUN: 5 mg/dL — ABNORMAL LOW (ref 6–23)
CO2: 24 mmol/L (ref 19–32)
Calcium: 9.4 mg/dL (ref 8.4–10.5)
Chloride: 103 mmol/L (ref 96–112)
Creatinine, Ser: 0.6 mg/dL (ref 0.50–1.10)
GFR calc Af Amer: >90 mL/min (ref >90)
GFR calc non Af Amer: >90 mL/min (ref >90)
Glucose, Bld: 95 mg/dL (ref 70–99)
Potassium: 3.3 mmol/L — ABNORMAL LOW (ref 3.5–5.1)
Sodium: 137 mmol/L (ref 135–145)

## 2014-08-12 LAB — TSH: TSH: <0.01 u[IU]/mL — ABNORMAL LOW (ref 0.350–4.500)

## 2014-08-12 MED ORDER — SODIUM CHLORIDE 0.9 % IV BOLUS (SEPSIS)
1000.0000 mL | Freq: Once | INTRAVENOUS | Status: AC
  Administered 2014-08-12: 1000 mL via INTRAVENOUS

## 2014-08-12 MED ORDER — DIPHENHYDRAMINE HCL 50 MG/ML IJ SOLN
25.0000 mg | Freq: Once | INTRAMUSCULAR | Status: AC
  Administered 2014-08-12: 25 mg via INTRAVENOUS
  Filled 2014-08-12: qty 1

## 2014-08-12 MED ORDER — DEXAMETHASONE SODIUM PHOSPHATE 10 MG/ML IJ SOLN
10.0000 mg | Freq: Once | INTRAMUSCULAR | Status: AC
  Administered 2014-08-12: 10 mg via INTRAMUSCULAR
  Filled 2014-08-12: qty 1

## 2014-08-12 MED ORDER — DIPHENHYDRAMINE HCL 25 MG PO CAPS: 25.0000 mg | ORAL_CAPSULE | Freq: Once | ORAL | Status: DC

## 2014-08-12 NOTE — ED Notes (Addendum)
Pt reports increase difficulty swallowing since Monday related to allergic reaction to Tapazole. Seen Thursday and given prednisone. Pt speaking complete sentences and NAD at present time. VS WNL.

## 2014-08-12 NOTE — ED Provider Notes (Signed)
CSN: [id number]     Arrival date & time 08/12/14  1802 History   First MD Initiated Contact with Patient 08/12/14 1857     Chief Complaint  Patient presents with  . Allergic Reaction     (Consider location/radiation/quality/duration/timing/severity/associated sxs/prior Treatment) HPI  Rebecca Sanchez is a(n) 44 y.o. female who presents to the Ed with c/o allergic reaction. She was recently diagnosed with hypothyroidism. She c/o difficulty swallowing and feels that there is swelling in her throat. She states that she is unable to swallow foods. She has a pmh of Food allergies but denies a hx of anaphylaxis and has never used an Epipen. She was seen at the end of Feburary  For suspicion of swallowing a foreign body (chicken bone). She was seen again for days ago for the same complaint. She feels that her symptoms are due to allergic reaction from the Methimazole. She discontinued the medication 4 days ago after her ER visit. She was discharged with prednisone which she states that she is unable to swallow. She also stopped taking her Propranalol because she thinks she may be having an allergic reaction to this as well. She took benadryl today and was able to swallow it but thinks it made her symptoms worsen. She denies wheezing, oral swelling,hives, facial swelling. She states that she "can't breathe." but speaks with normal phonation and breathes easily.  Past Medical History  Diagnosis Date  . Chronic bronchitis   . Sarcoid   . HTN (hypertension)   . Asthma   . Thyromegaly   . Chest pain     a. 04/2012 Echo: EF 65%, nl wall motion.  . Anemia   . GERD (gastroesophageal reflux disease)   . Heart murmur    Past Surgical History  Procedure Laterality Date  . Hernia repair    . Bunionectomy     Family History  Problem Relation Age of Onset  . Cancer Maternal Grandmother     lung  . Cancer Paternal Grandmother     lung  . Hypertension Mother   . Hypertension Father    History   Substance Use Topics  . Smoking status: Never Smoker   . Smokeless tobacco: Never Used  . Alcohol Use: No   OB History    Gravida Para Term Preterm AB TAB SAB Ectopic Multiple Living   4 3 3  1  1         Review of Systems  Constitutional: Negative for fever and chills.  HENT: Positive for trouble swallowing.   Respiratory: Positive for choking and shortness of breath. Negative for cough, chest tightness, wheezing and stridor.   Skin: Negative for rash.  All other systems reviewed and are negative.   Allergies  Azithromycin; Motrin; Peanut-containing drug products; and Atenolol  Home Medications   Prior to Admission medications   Medication Sig Start Date End Date Taking? Authorizing Provider  albuterol (PROAIR HFA) 108 (90 BASE) MCG/ACT inhaler Inhale 2 puffs into the lungs every 6 (six) hours as needed for wheezing or shortness of breath.    Yes Historical Provider, MD  diphenhydrAMINE (BENADRYL) 25 MG tablet Take 1 tablet (25 mg total) by mouth every 6 (six) hours. 08/07/14  Yes Hanna Patel-Mills, PA-C  fluticasone (FLONASE) 50 MCG/ACT nasal spray Place 2 sprays into both nostrils 2 (two) times daily as needed for allergies.    Yes Historical Provider, MD  predniSONE (STERAPRED UNI-PAK) 10 MG tablet Take by mouth taper from 4 doses each day to 1 dose  and stop. 08/07/14  Yes Hanna Patel-Mills, PA-C  amoxicillin (AMOXIL) 500 MG capsule Take 4 capsules (2,000 mg total) by mouth once. Patient not taking: Reported on 07/23/2014 07/18/14   Shelly Bombard, MD  famotidine (PEPCID) 20 MG tablet Take 1 tablet (20 mg total) by mouth 2 (two) times daily. Patient not taking: Reported on 06/09/2014 05/27/14   Charlesetta Shanks, MD  Oxycodone HCl 10 MG TABS Take 1 tablet (10 mg total) by mouth every 6 (six) hours as needed. Patient not taking: Reported on 07/18/2014 06/23/14   Shelly Bombard, MD   BP 134/79 mmHg  Pulse 71  Temp(Src) 98.5 F (36.9 C) (Oral)  Resp 16  SpO2 100%  LMP  08/07/2014 Physical Exam  Constitutional: She is oriented to person, place, and time. She appears well-developed and well-nourished. No distress.  HENT:  Head: Normocephalic and atraumatic.  Mouth/Throat: Oropharynx is clear and moist.  Oropharynx is clear and moist without any erythema, edema Normal phonation, normal speech,  Has some difficulty tolerating oral secretions  Eyes: Conjunctivae are normal. No scleral icterus.  Neck: Normal range of motion. No JVD present. No tracheal deviation present. Thyromegaly present.  Cardiovascular: Normal rate, regular rhythm and normal heart sounds.  Exam reveals no gallop and no friction rub.   No murmur heard. Pulmonary/Chest: Effort normal and breath sounds normal. No stridor. No respiratory distress. She has no wheezes.  Normal lung sounds, no wheezing, no respiratory difficulty  Abdominal: Soft. Bowel sounds are normal. She exhibits no distension and no mass. There is no tenderness. There is no guarding.  Lymphadenopathy:    She has no cervical adenopathy.  Neurological: She is alert and oriented to person, place, and time.  Skin: Skin is warm and dry. She is not diaphoretic.  No hives  Nursing note and vitals reviewed.   ED Course  Procedures (including critical care time) Labs Review Labs Reviewed  CBC WITH DIFFERENTIAL/PLATELET - Abnormal; Notable for the following:    Platelets 423 (*)    Neutrophils Relative % 35 (*)    Lymphocytes Relative 58 (*)    Lymphs Abs 4.8 (*)    All other components within normal limits  BASIC METABOLIC PANEL - Abnormal; Notable for the following:    Potassium 3.3 (*)    BUN 5 (*)    All other components within normal limits  TSH - Abnormal; Notable for the following:    TSH <0.010 (*)    All other components within normal limits    Imaging Review No results found.   EKG Interpretation   Date/Time:  Saturday August 12 2014 20:17:59 EDT Ventricular Rate:  71 PR Interval:  133 QRS Duration:  88 QT Interval:  432 QTC Calculation: 469 R Axis:   73 Text Interpretation:  Sinus rhythm No significant change was found  Confirmed by YAO  MD, DAVID ([id number]) on 08/12/2014 11:09:52 PM      MDM   Final diagnoses:  SOB (shortness of breath)  Thyroid mass  Globus sensation    Patient globus sensation. NO signs of allergic reaction and the sensation has been progressively worseining for 1 month. The patient is not treating her Hyperthyroidism and TSH is undetectable. I have repeatedly discussed the fact that this is likely due to an enlarging thyroid nodule.Patient states she cannot tolerate secretions, however she is not actively drooling or spitting. She states that she cannot breathe, but she has not stridor and is able to carry on full conversations without interruption.  She has no change in phonation.  Patient seen in shared visit with attending physician Dr. Darl Householder who re quests imaging and admission. The patient given decadron and benadryl without any change in her symptoms. Labs show mild hypokalemia.,  Slightly elevated platelet count.    Patient CT shows 3cm thyroid mass with slight tracheal deviation, No airway compromise. I placed consult for admission with Dr. Dyann Kief.   Dr. Dyann Kief has seen and evaluated the patient and does not feel she meets inpatient admission criteria. He suggest she begin taking the prescribed thyroid medication and propranolol and follow up asap with her endocrinologist. I agree with Dr. Anson Fret assessment and do not feel she needs monitoring for impending airway compromise. She has direct access to endocronology who are best equipped to manage her condition. Patient advised to stay on liquid diet.  she appears safe for discharge.    Margarita Mail, PA-C 08/17/14 [place], PA-C 09/16/14 1900  Wandra Arthurs, MD 09/19/14 1325

## 2014-08-13 DIAGNOSIS — R1310 Dysphagia, unspecified: Secondary | ICD-10-CM | POA: Diagnosis not present

## 2014-08-13 DIAGNOSIS — E0590 Thyrotoxicosis, unspecified without thyrotoxic crisis or storm: Secondary | ICD-10-CM | POA: Diagnosis not present

## 2014-08-13 DIAGNOSIS — E049 Nontoxic goiter, unspecified: Secondary | ICD-10-CM | POA: Diagnosis not present

## 2014-08-13 DIAGNOSIS — R0602 Shortness of breath: Secondary | ICD-10-CM | POA: Diagnosis not present

## 2014-08-13 NOTE — Consult Note (Signed)
[facility] Medical Consultation  Rebecca Sanchez DGL:[id number] DOB: 05/21/1971 DOA: 08/12/2014 PCP: Rebecca Mccreedy, MD   Requesting physician: Dr. Darl Householder Date of consultation: 08/12/14 Reason for consultation: difficulty swallowing/SOB  Impression/Recommendations 1-mild shortness of breath and dysphagia: Most likely secondary to enlargement of the left lobe of her thyroid with ongoing hyperthyroidism problem.  -No signs of acute thyroid stone or significant distress affecting her breathing or capacity for hydration at this moment -Patient with established workup and follow-up for this condition in the up coming weeks. -Will not be able to do or offer anything acute/faster regarding her pending evaluation or treatment in the inpatient setting at this moment. -Patient instructed to crush her medications and to take that with applesauce to make it easier for her to swallow them.  *The rest of her medical problems remains stable and the plan is to continue current medication regimen. She will follow-up with her PCP in the outpatient setting for any future medication adjustment needs.  In my opinion patient can be safely discharged home with instructions to keep herself hydrated mainly following a dysphagia 1 diet. She already has upcoming appointment with endocrinologist and also to have a thyroid uptake imaging on 08/22/2014.  Chief Complaint: Mild shortness of breath and difficulty swallowing.  HPI: 44 year old female with a past medical history significant for sarcoidosis, hypertension, history of asthma and GERD; who has been recently diagnosed with goiter and hyperthyroidism; presented to the ED secondary to concerns of allergic reaction to methimazole and propranolol. These medications have inside her approximately a month ago after diagnosis of for hyperthyroidism. Approximately 4 days prior to this visit patient reports having some difficulty swallowing and feeling short of breath;  at that time she has a set of the symptom to the medication and stopped taking them. Patient was seen 4 days ago in the ED and was asked to take some prednisone and to set up follow-up appointment with her endocrinologist. Patient reports not being able to take prednisone due to difficulty swallowing; and decided to return to the emergency department for further evaluation.  In the ED, patient has a CT of the tissue/neck without contrast that demonstrated left low thyroid mass measuring over 3 cm with mild rightward displacement of the trachea without any significant narrowing or airway compression.  Patient is afebrile, normal vital signs, actively no coughing during examination, no tachypnea, no chest pain, good oxygen saturation on room air and without any signs of distress.   Review of Systems:  Negative except as otherwise mentioned on history of present illness.  Past Medical History  Diagnosis Date  . Chronic bronchitis   . Sarcoid   . HTN (hypertension)   . Asthma   . Thyromegaly   . Chest pain     a. 04/2012 Echo: EF 65%, nl wall motion.  . Anemia   . GERD (gastroesophageal reflux disease)   . Heart murmur    Past Surgical History  Procedure Laterality Date  . Hernia repair    . Bunionectomy     Social History:  reports that she has never smoked. She has never used smokeless tobacco. She reports that she does not drink alcohol or use illicit drugs.  Allergies  Allergen Reactions  . Azithromycin Shortness Of Breath  . Motrin [Ibuprofen] Shortness Of Breath  . Peanut-Containing Drug Products Anaphylaxis  . Atenolol     unknown   Family History  Problem Relation Age of Onset  . Cancer Maternal Grandmother     lung  .  Cancer Paternal Grandmother     lung  . Hypertension Mother   . Hypertension Father     Prior to Admission medications   Medication Sig Start Date End Date Taking? Authorizing Provider  albuterol (PROAIR HFA) 108 (90 BASE) MCG/ACT inhaler Inhale 2  puffs into the lungs every 6 (six) hours as needed for wheezing or shortness of breath.    Yes Historical Provider, MD  diphenhydrAMINE (BENADRYL) 25 MG tablet Take 1 tablet (25 mg total) by mouth every 6 (six) hours. 08/07/14  Yes Hanna Patel-Mills, PA-C  fluticasone (FLONASE) 50 MCG/ACT nasal spray Place 2 sprays into both nostrils 2 (two) times daily as needed for allergies.    Yes Historical Provider, MD  predniSONE (STERAPRED UNI-PAK) 10 MG tablet Take by mouth taper from 4 doses each day to 1 dose and stop. 08/07/14  Yes Hanna Patel-Mills, PA-C  propranolol (INDERAL) 20 MG tablet Take 10 mg by mouth 2 (two) times daily.    Yes Historical Provider, MD  amoxicillin (AMOXIL) 500 MG capsule Take 4 capsules (2,000 mg total) by mouth once. Patient not taking: Reported on 07/23/2014 07/18/14   Shelly Bombard, MD  famotidine (PEPCID) 20 MG tablet Take 1 tablet (20 mg total) by mouth 2 (two) times daily. Patient not taking: Reported on 06/09/2014 05/27/14   Charlesetta Shanks, MD  Oxycodone HCl 10 MG TABS Take 1 tablet (10 mg total) by mouth every 6 (six) hours as needed. Patient not taking: Reported on 07/18/2014 06/23/14   Shelly Bombard, MD   Physical Exam: Blood pressure 147/81, pulse 78, temperature 97.8 F (36.6 C), temperature source Oral, resp. rate 14, last menstrual period 08/07/2014, SpO2 99 %. Filed Vitals:   08/12/14 2330  BP: 147/81  Pulse: 78  Temp:   Resp: 14     General:  Alert, awake and oriented 3; home and comfortable. Patient able to speak in full sentences, no stridor, no wheezing, no drooling or spiting; patient w/o tachypnea and able to swallow her own saliva without problem during conversation.   Eyes: PERRLA, no icterus, no nystagmus; extraocular muscles intact  ENT: No erythema or exudates appreciated in her throat; mild dryness of her lips; no drainage out of her ears or nostrils  Neck: Soft, positive goiter/nodule, no JVD  Cardiovascular: RRR, no rubs or gallops; no  lower extremity swelling  Respiratory: Clear to auscultation bilaterally  Abdomen: Soft, nontender, nondistended, positive bowel sounds  Skin: No rash, petechiae, bruises or open wounds.  Musculoskeletal: FROM, good muscle tone bilaterally; no joint swelling  Psychiatric: No suicidal ideation, no hallucinations; a stable mood and appropriate speech/conversation  Neurologic: Alert, awake and oriented 3; cranial nerve II-12 grossly intact; muscle strength 5 out of 5 bilaterally symmetrically (upper and lower extremities). No focal deficit  Labs on Admission:  Basic Metabolic Panel:  Recent Labs Lab 08/12/14 1952  NA 137  K 3.3*  CL 103  CO2 24  GLUCOSE 95  BUN 5*  CREATININE 0.60  CALCIUM 9.4   CBC:  Recent Labs Lab 08/12/14 1952  WBC 8.4  NEUTROABS 2.9  HGB 13.7  HCT 39.4  MCV 82.6  PLT 423*   Radiological Exams on Admission: Dg Chest 2 View  08/12/2014   CLINICAL DATA:  Shortness of breath, chest pain, history of sarcoid  EXAM: CHEST  2 VIEW  COMPARISON:  05/27/2014  FINDINGS: Lungs are clear.  No pleural effusion or pneumothorax.  The heart is normal in size.  Visualized osseous structures are  within normal limits.  IMPRESSION: Normal chest radiographs.   Electronically Signed   By: Julian Hy M.D.   On: 08/12/2014 20:30   Ct Soft Tissue Neck Wo Contrast  08/13/2014   CLINICAL DATA:  Goiter, neck swelling, difficulty swallowing since last week. Evaluate for airway impingement.  EXAM: CT NECK WITHOUT CONTRAST  TECHNIQUE: Multidetector CT imaging of the neck was performed following the standard protocol without intravenous contrast.  COMPARISON:  Radiographs 07/23/2014  FINDINGS: There is a 2.9 x 3.4 cm mass in the left thyroid lobe. The right lobe appears unremarkable. There is no cervical adenopathy evident, although the lack of intravenous contrast limits evaluation for small nodes. The cervical trachea is mildly displaced to the right by the thyroid mass but  it is widely patent. Remainder of the airway is unremarkable. There are unremarkable unenhanced appearances of the parotid and submandibular glands. Included portions of the upper chest are negative for significant abnormality. Included portions of the paranasal sinuses are clear. There is poor pneumatization of the mastoids, left worse than right.  IMPRESSION: Left lobe thyroid mass measuring over 3 cm. Consider ultrasound for characterization.  No airway compromise. There is mild rightward displacement of the trachea without significant narrowing.   Electronically Signed   By: Andreas Newport M.D.   On: 08/13/2014 00:10    EKG:  Regular rate, sinus rhythm, no acute ischemic changes.  Time spent: 30 minutes  Barton Dubois [facility] Pager [phone]  If 7PM-7AM, please contact night-coverage [email or web address] Password [email or web address] 08/13/2014, 1:04 AM

## 2014-08-13 NOTE — Discharge Instructions (Signed)
Please resume taking your Methimazole and your propranalol immediately. Please call Dr. Carlis Abbott immediately for a follow up appointment on  Monday. Continue with a liquid diet.   Dysphagia Swallowing problems (dysphagia) occur when solids and liquids seem to stick in your throat on the way down to your stomach, or the food takes longer to get to the stomach. Other symptoms include regurgitating food, noises coming from the throat, chest discomfort with swallowing, and a feeling of fullness or the feeling of something being stuck in your throat when swallowing. When blockage in your throat is complete, it may be associated with drooling. CAUSES  Problems with swallowing may occur because of problems with the muscles. The food cannot be propelled in the usual manner into your stomach. You may have ulcers, scar tissue, or inflammation in the tube down which food travels from your mouth to your stomach (esophagus), which blocks food from passing normally into the stomach. Causes of inflammation include:  Acid reflux from your stomach into your esophagus.  Infection.  Radiation treatment for cancer.  Medicines taken without enough fluids to wash them down into your stomach. You may have nerve problems that prevent signals from being sent to the muscles of your esophagus to contract and move your food down to your stomach. Globus pharyngeus is a relatively common problem in which there is a sense of an obstruction or difficulty in swallowing, without any physical abnormalities of the swallowing passages being found. This problem usually improves over time with reassurance and testing to rule out other causes. DIAGNOSIS Dysphagia can be diagnosed and its cause can be determined by tests in which you swallow a white substance that helps illuminate the inside of your throat (contrast medium) while X-rays are taken. Sometimes a flexible telescope that is inserted down your throat (endoscopy) to look at your  esophagus and stomach is used. TREATMENT   If the dysphagia is caused by acid reflux or infection, medicines may be used.  If the dysphagia is caused by problems with your swallowing muscles, swallowing therapy may be used to help you strengthen your swallowing muscles.  If the dysphagia is caused by a blockage or mass, procedures to remove the blockage may be done. HOME CARE INSTRUCTIONS  Try to eat soft food that is easier to swallow and check your weight on a daily basis to be sure that it is not decreasing.  Be sure to drink liquids when sitting upright (not lying down). SEEK MEDICAL CARE IF:  You are losing weight because you are unable to swallow.  You are coughing when you drink liquids (aspiration).  You are coughing up partially digested food. SEEK IMMEDIATE MEDICAL CARE IF:  You are unable to swallow your own saliva .  You are having shortness of breath or a fever, or both.  You have a hoarse voice along with difficulty swallowing. MAKE SURE YOU:  Understand these instructions.  Will watch your condition.  Will get help right away if you are not doing well or get worse. Document Released: 05/09/2000 Document Revised: 09/26/2013 Document Reviewed: 10/29/2012 [facility] Patient Information 2015 [place], [place]. This information is not intended to replace advice given to you by your health care provider. Make sure you discuss any questions you have with your health care provider.  Globus Syndrome Globus Syndrome is a feeling of a lump or a sensation of something caught in your throat. Eating food or drinking fluids does not seem to get rid of it. Yet it is not  noticeable during the actual act of swallowing food or liquids. Usually there is nothing physically wrong. It is troublesome because it is an unpleasant sensation which is sometimes difficult to ignore and at times may seem to worsen. The syndrome is quite common. It is estimated 45% of the population experiences  features of the condition at some stage during their lives. The symptoms are usually temporary. The largest group of people who feel the need to seek medical treatment is females between the ages of 69 to 70.  CAUSES  Globus Syndrome appears to be triggered by or aggravated by stress, anxiety and depression.  Tension related to stress could product abnormal muscle spasms in the esophagus which would account for the sensation of a lump or ball in your throat.  Frequent swallowing or drying of the throat caused by anxiety or other strong emotions can also produce this uncomfortable sensation in your throat.  Fear and sadness can be expressed by the body in many ways. For instance, if you had a relative with throat cancer you might become overly concerned about your own health and develop uncomfortable sensations in your throat.  The reaction to a crisis or a trauma event in your life can take the form of a lump in your throat. It is as if you are indirectly saying you can not handle or "swallow" one more thing. DIAGNOSIS  Usually your caregiver will know what is wrong by talking to you and examining you. If the condition persists for several days, more testing may be done to make sure there is not another problem present. This is usually not the case. TREATMENT   Reassurance is often the best treatment available. Usually the problem leaves without treatment over several days.  Sometimes anti-anxiety medications may be prescribed.  Counseling or talk therapy can also help with strong underlying emotions.  Note that in most cases this is not something that keeps coming back and you should not be concerned or worried. Document Released: 08/02/2003 Document Revised: 08/04/2011 Document Reviewed: 12/30/2007 [facility] Patient Information 2015 [place], [place]. This information is not intended to replace advice given to you by your health care provider. Make sure you discuss any questions you have with  your health care provider. Full Liquid Diet A full liquid diet may be used:   To help you transition from a clear liquid diet to a soft diet.   When your body is healing and can only tolerate foods that are easy to digest.  Before or after certain a procedure, test, or surgery (such as stomach or intestinal surgeries).   If you have trouble swallowing or chewing.  A full liquid diet includes fluids and foods that are liquid or will become liquid at room temperature. The full liquid diet gives you the proteins, fluids, salts, and minerals that you need for energy. If you continue this diet for more than 72 hours, talk to your health care provider about how many calories you need to consume. If you continue the diet for more than 5 days, talk to your health care provider about taking a multivitamin or a nutritional supplement. WHAT DO I NEED TO KNOW ABOUT A FULL LIQUID DIET?  You may have any liquid.  You may have any food that becomes a liquid at room temperature. The food is considered a liquid if it can be poured off a spoon at room temperature.  Drink one serving of citrus or vitamin C-enriched fruit juice daily. WHAT FOODS CAN I EAT?  Grains Any grain food that can be pureed in soup (such as crackers, pasta, and rice). Hot cereal (such as farina or oatmeal) that has been blended. Talk to your health care provider or dietitian about these foods. Vegetables Pulp-free tomato or vegetable juice. Vegetables pureed in soup.  Fruits Fruit juice, including nectars and juices with pulp. Meats and Other Protein Sources Eggs in custard, eggnog mix, and eggs used in ice cream or pudding. Strained meats, like in baby food, may be allowed. Consult your health care provider.  Dairy Milk and milk-based beverages, including milk shakes and instant breakfast mixes. Smooth yogurt. Pureed cottage cheese. Avoid these foods if they are not well tolerated. Beverages All beverages, including liquid  nutritional supplements. Ask your health care provider if you can have carbonated beverages. They may not be well tolerated. Condiments Iodized salt, pepper, spices, and flavorings. Cocoa powder. Vinegar, ketchup, yellow mustard, smooth sauces (such as hollandaise, cheese sauce, or white sauce), and soy sauce. Sweets and Desserts Custard, smooth pudding. Flavored gelatin. Tapioca, junket. Plain ice cream, sherbet, fruit ices. Frozen ice pops, frozen fudge pops, pudding pops, and other frozen bars with cream. Syrups, including chocolate syrup. Sugar, honey, jelly.  Fats and Oils Margarine, butter, cream, sour cream, and oils. Other Broth and cream soups. Strained, broth-based soups. The items listed above may not be a complete list of recommended foods or beverages. Contact your dietitian for more options.  WHAT FOODS CAN I NOT EAT? Grains All breads. Grains are not allowed unless they are pureed into soup. Vegetables Vegetables are not allowed unless they are juiced, or cooked and pureed into soup. Fruits Fruits are not allowed unless they are juiced. Meats and Other Protein Sources Any meat or fish. Cooked or raw eggs. Nut butters.  Dairy Cheese.  Condiments Stone ground mustards. Fats and Oils Fats that are coarse or chunky. Sweets and Desserts Ice cream or other frozen desserts that have any solids in them or on top, such as nuts, chocolate chips, and pieces of cookies. Cakes. Cookies. Candy. Others Soups with chunks or pieces in them. The items listed above may not be a complete list of foods and beverages to avoid. Contact your dietitian for more information. Document Released: 05/12/2005 Document Revised: 05/17/2013 Document Reviewed: 03/17/2013 [facility] Patient Information 2015 [place], [place]. This information is not intended to replace advice given to you by your health care provider. Make sure you discuss any questions you have with your health care provider.

## 2014-08-14 DIAGNOSIS — E538 Deficiency of other specified B group vitamins: Secondary | ICD-10-CM | POA: Diagnosis not present

## 2014-08-14 DIAGNOSIS — I10 Essential (primary) hypertension: Secondary | ICD-10-CM | POA: Diagnosis not present

## 2014-08-14 DIAGNOSIS — E559 Vitamin D deficiency, unspecified: Secondary | ICD-10-CM | POA: Diagnosis not present

## 2014-08-14 DIAGNOSIS — J45909 Unspecified asthma, uncomplicated: Secondary | ICD-10-CM | POA: Diagnosis not present

## 2014-08-14 DIAGNOSIS — M545 Low back pain: Secondary | ICD-10-CM | POA: Diagnosis not present

## 2014-08-14 DIAGNOSIS — R1310 Dysphagia, unspecified: Secondary | ICD-10-CM | POA: Diagnosis not present

## 2014-08-14 DIAGNOSIS — D860 Sarcoidosis of lung: Secondary | ICD-10-CM | POA: Diagnosis not present

## 2014-08-14 DIAGNOSIS — K30 Functional dyspepsia: Secondary | ICD-10-CM | POA: Diagnosis not present

## 2014-08-14 DIAGNOSIS — I517 Cardiomegaly: Secondary | ICD-10-CM | POA: Diagnosis not present

## 2014-08-14 DIAGNOSIS — J029 Acute pharyngitis, unspecified: Secondary | ICD-10-CM | POA: Diagnosis not present

## 2014-08-16 ENCOUNTER — Encounter (HOSPITAL_COMMUNITY): Payer: Self-pay

## 2014-08-16 ENCOUNTER — Emergency Department (HOSPITAL_COMMUNITY)
Admission: EM | Admit: 2014-08-16 | Discharge: 2014-08-16 | Disposition: A | Discharge status: Home / Self Care | Payer: Medicare Other | Attending: Emergency Medicine | Admitting: Emergency Medicine

## 2014-08-16 DIAGNOSIS — Z79899 Other long term (current) drug therapy: Secondary | ICD-10-CM | POA: Diagnosis not present

## 2014-08-16 DIAGNOSIS — R1310 Dysphagia, unspecified: Secondary | ICD-10-CM | POA: Diagnosis not present

## 2014-08-16 DIAGNOSIS — I10 Essential (primary) hypertension: Secondary | ICD-10-CM | POA: Insufficient documentation

## 2014-08-16 DIAGNOSIS — R0989 Other specified symptoms and signs involving the circulatory and respiratory systems: Secondary | ICD-10-CM | POA: Diagnosis present

## 2014-08-16 DIAGNOSIS — J45909 Unspecified asthma, uncomplicated: Secondary | ICD-10-CM | POA: Diagnosis not present

## 2014-08-16 DIAGNOSIS — K219 Gastro-esophageal reflux disease without esophagitis: Secondary | ICD-10-CM | POA: Diagnosis not present

## 2014-08-16 DIAGNOSIS — Z862 Personal history of diseases of the blood and blood-forming organs and certain disorders involving the immune mechanism: Secondary | ICD-10-CM | POA: Insufficient documentation

## 2014-08-16 DIAGNOSIS — E079 Disorder of thyroid, unspecified: Secondary | ICD-10-CM | POA: Diagnosis not present

## 2014-08-16 DIAGNOSIS — F458 Other somatoform disorders: Secondary | ICD-10-CM | POA: Diagnosis not present

## 2014-08-16 DIAGNOSIS — R011 Cardiac murmur, unspecified: Secondary | ICD-10-CM | POA: Diagnosis not present

## 2014-08-16 NOTE — ED Provider Notes (Signed)
CSN: [id number]     Arrival date & time 08/16/14  1251 History   First MD Initiated Contact with Patient 08/16/14 1310     Chief Complaint  Patient presents with  . Chest Pain  . Pruritis  . throat tightness      HPI  Patient presents for her fourth time since January with a sensation of tightness in her throat.  One point was thought to a swallowed a fishbone. One point was thought to be having allergic reaction to Tapazole and propranolol. Had a CT scan showing a thyroid nodule that minimally displaces the trachea. She reports that she cannot eat or drink. States that she feels short of breath laying flat.  She was tried on prednisone and Benadryl. States she cannot swallow the pills. Her most recent visit showed the CT scan imaging with left thyroid nodule. Found to be hyperthyroid. Station and told she's had a goiter for years. Has not currently on medication for hyperthyroidism.  As I examine her she is able to drink water. She states she still having bowel movements and urinating. She has no difference in her voice here. Son states that she is able to eat "some" at home and is able to drink still.  When I have this discussion with family about possible etiologies son states at one point she was told that "might be a globus". She denies that she gets frankly anxious. However she almost constantly touching or rubbing at her throat seems anxious.  Past Medical History  Diagnosis Date  . Chronic bronchitis   . Sarcoid   . HTN (hypertension)   . Asthma   . Thyromegaly   . Chest pain     a. 04/2012 Echo: EF 65%, nl wall motion.  . Anemia   . GERD (gastroesophageal reflux disease)   . Heart murmur    Past Surgical History  Procedure Laterality Date  . Hernia repair    . Bunionectomy     Family History  Problem Relation Age of Onset  . Cancer Maternal Grandmother     lung  . Cancer Paternal Grandmother     lung  . Hypertension Mother   . Hypertension Father    History   Substance Use Topics  . Smoking status: Never Smoker   . Smokeless tobacco: Never Used  . Alcohol Use: No   OB History    Gravida Para Term Preterm AB TAB SAB Ectopic Multiple Living   4 3 3  1  1         Review of Systems  Constitutional: Negative for fever, chills, diaphoresis, appetite change and fatigue.  HENT: Positive for trouble swallowing. Negative for mouth sores and sore throat.   Eyes: Negative for visual disturbance.  Respiratory: Negative for cough, chest tightness, shortness of breath and wheezing.   Cardiovascular: Negative for chest pain.  Gastrointestinal: Negative for nausea, vomiting, abdominal pain, diarrhea and abdominal distention.  Endocrine: Negative for polydipsia, polyphagia and polyuria.  Genitourinary: Negative for dysuria, frequency and hematuria.  Musculoskeletal: Negative for gait problem.  Skin: Negative for color change, pallor and rash.  Neurological: Negative for dizziness, syncope, light-headedness and headaches.  Hematological: Does not bruise/bleed easily.  Psychiatric/Behavioral: Negative for behavioral problems and confusion.      Allergies  Azithromycin; Motrin; Peanut-containing drug products; and Atenolol  Home Medications   Prior to Admission medications   Medication Sig Start Date End Date Taking? Authorizing Provider  albuterol (PROAIR HFA) 108 (90 BASE) MCG/ACT inhaler Inhale 2  puffs into the lungs every 6 (six) hours as needed for wheezing or shortness of breath.    Yes Historical Provider, MD  diphenhydrAMINE (BENADRYL) 25 MG tablet Take 1 tablet (25 mg total) by mouth every 6 (six) hours. 08/07/14  Yes Hanna Patel-Mills, PA-C  fluticasone (FLONASE) 50 MCG/ACT nasal spray Place 2 sprays into both nostrils 2 (two) times daily as needed for allergies.    Yes Historical Provider, MD  amoxicillin (AMOXIL) 500 MG capsule Take 4 capsules (2,000 mg total) by mouth once. Patient not taking: Reported on 07/23/2014 07/18/14   Shelly Bombard, MD  famotidine (PEPCID) 20 MG tablet Take 1 tablet (20 mg total) by mouth 2 (two) times daily. Patient not taking: Reported on 06/09/2014 05/27/14   Charlesetta Shanks, MD  Oxycodone HCl 10 MG TABS Take 1 tablet (10 mg total) by mouth every 6 (six) hours as needed. Patient not taking: Reported on 07/18/2014 06/23/14   Shelly Bombard, MD  predniSONE (STERAPRED UNI-PAK) 10 MG tablet Take by mouth taper from 4 doses each day to 1 dose and stop. 08/07/14   Hanna Patel-Mills, PA-C   BP 164/96 mmHg  Pulse 107  Temp(Src) 98.1 F (36.7 C) (Oral)  Resp 20  SpO2 100%  LMP 08/07/2014 Physical Exam  Constitutional: She is oriented to person, place, and time. She appears well-developed and well-nourished. No distress.  HENT:  Head: Normocephalic.  Eyes: Conjunctivae are normal. Pupils are equal, round, and reactive to light. No scleral icterus.  Neck: Normal range of motion. Neck supple. No thyromegaly present.  Neck is been without obvious enlargement of thyroid. No asymmetry. Normal symmetric movement with ongoing. She does not get symptomatic laying supine. Normal voice. Not coarser dysphonic. Posterior pharynx benign. Tonsillar pillars normal. Uvula normal. Floor the mouth soft not elevated.  Cardiovascular: Normal rate and regular rhythm.  Exam reveals no gallop and no friction rub.   No murmur heard. Pulmonary/Chest: Effort normal and breath sounds normal. No respiratory distress. She has no wheezes. She has no rales.  Abdominal: Soft. Bowel sounds are normal. She exhibits no distension. There is no tenderness. There is no rebound.  Musculoskeletal: Normal range of motion.  Neurological: She is alert and oriented to person, place, and time.  Skin: Skin is warm and dry. No rash noted.  Psychiatric: She has a normal mood and affect. Her behavior is normal.    ED Course  Procedures (including critical care time) Labs Review Labs Reviewed - No data to display  Imaging Review No results  found.   EKG Interpretation None      MDM   Final diagnoses:  Dysphagia  Globus hystericus    She had a structurally normal CT other than 3 cm left thyroid nodule. She is seeing primary care regarding this. She has a GI appointment. I do not find an indication for imaging or additional studies today. 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 Vascular to slowly increase progress her diet from liquids to semisolid non-chew.    Tanna Furry, MD 08/16/14 [phone]

## 2014-08-16 NOTE — ED Notes (Signed)
Patient states she had allergic reaction symptoms 4 days and was seen in the ED. Patient states her symptoms are not any better. Patient c/o throat and chest tightness, itching, and nausea.

## 2014-08-16 NOTE — Discharge Instructions (Signed)
Keep your appointment with the gastroenterologist that has been scheduled for you.  Continue to take in small amounts, little bits at a time.  I would encourage you to slowly advance from liquids to soft and non-chew diet and slowly advance your diet. Globus Syndrome Globus Syndrome is a feeling of a lump or a sensation of something caught in your throat. Eating food or drinking fluids does not seem to get rid of it. Yet it is not noticeable during the actual act of swallowing food or liquids. Usually there is nothing physically wrong. It is troublesome because it is an unpleasant sensation which is sometimes difficult to ignore and at times may seem to worsen. The syndrome is quite common. It is estimated 45% of the population experiences features of the condition at some stage during their lives. The symptoms are usually temporary. The largest group of people who feel the need to seek medical treatment is females between the ages of 46 to 41.  CAUSES  Globus Syndrome appears to be triggered by or aggravated by stress, anxiety and depression.  Tension related to stress could product abnormal muscle spasms in the esophagus which would account for the sensation of a lump or ball in your throat.  Frequent swallowing or drying of the throat caused by anxiety or other strong emotions can also produce this uncomfortable sensation in your throat.  Fear and sadness can be expressed by the body in many ways. For instance, if you had a relative with throat cancer you might become overly concerned about your own health and develop uncomfortable sensations in your throat.  The reaction to a crisis or a trauma event in your life can take the form of a lump in your throat. It is as if you are indirectly saying you can not handle or "swallow" one more thing. DIAGNOSIS  Usually your caregiver will know what is wrong by talking to you and examining you. If the condition persists for several days, more testing may  be done to make sure there is not another problem present. This is usually not the case. TREATMENT   Reassurance is often the best treatment available. Usually the problem leaves without treatment over several days.  Sometimes anti-anxiety medications may be prescribed.  Counseling or talk therapy can also help with strong underlying emotions.  Note that in most cases this is not something that keeps coming back and you should not be concerned or worried. Document Released: 08/02/2003 Document Revised: 08/04/2011 Document Reviewed: 12/30/2007 [facility] Patient Information 2015 [place], [place]. This information is not intended to replace advice given to you by your health care provider. Make sure you discuss any questions you have with your health care provider.

## 2014-08-22 ENCOUNTER — Ambulatory Visit (HOSPITAL_COMMUNITY): Admission: RE | Admit: 2014-08-22 | Payer: Medicare Other | Source: Ambulatory Visit

## 2014-08-22 DIAGNOSIS — E0500 Thyrotoxicosis with diffuse goiter without thyrotoxic crisis or storm: Secondary | ICD-10-CM | POA: Diagnosis not present

## 2014-08-23 ENCOUNTER — Encounter (HOSPITAL_COMMUNITY): Admission: RE | Admit: 2014-08-23 | Payer: Medicare Other | Source: Ambulatory Visit

## 2014-08-24 ENCOUNTER — Encounter (HOSPITAL_COMMUNITY)
Admission: RE | Admit: 2014-08-24 | Discharge: 2014-08-24 | Disposition: A | Discharge status: Home / Self Care | Payer: Medicare Other | Source: Ambulatory Visit | Attending: Internal Medicine | Admitting: Internal Medicine

## 2014-08-24 DIAGNOSIS — E0590 Thyrotoxicosis, unspecified without thyrotoxic crisis or storm: Secondary | ICD-10-CM | POA: Insufficient documentation

## 2014-08-25 ENCOUNTER — Encounter (HOSPITAL_COMMUNITY): Admission: RE | Admit: 2014-08-25 | Payer: Medicare Other | Source: Ambulatory Visit

## 2014-08-28 ENCOUNTER — Encounter (HOSPITAL_COMMUNITY)
Admission: RE | Admit: 2014-08-28 | Discharge: 2014-08-28 | Disposition: A | Discharge status: Home / Self Care | Payer: Medicare Other | Source: Ambulatory Visit | Attending: Internal Medicine | Admitting: Internal Medicine

## 2014-08-28 DIAGNOSIS — D860 Sarcoidosis of lung: Secondary | ICD-10-CM | POA: Diagnosis not present

## 2014-08-28 DIAGNOSIS — R634 Abnormal weight loss: Secondary | ICD-10-CM | POA: Diagnosis not present

## 2014-08-28 DIAGNOSIS — E0590 Thyrotoxicosis, unspecified without thyrotoxic crisis or storm: Secondary | ICD-10-CM | POA: Insufficient documentation

## 2014-08-28 DIAGNOSIS — R1310 Dysphagia, unspecified: Secondary | ICD-10-CM | POA: Diagnosis not present

## 2014-08-28 MED ORDER — SODIUM IODIDE I 131 CAPSULE
15.3000 | Freq: Once | INTRAVENOUS | Status: AC | PRN
  Administered 2014-08-28: 15.3 via ORAL

## 2014-08-29 ENCOUNTER — Encounter (HOSPITAL_COMMUNITY)
Admission: RE | Admit: 2014-08-29 | Discharge: 2014-08-29 | Disposition: A | Discharge status: Home / Self Care | Payer: Medicare Other | Source: Ambulatory Visit | Attending: Internal Medicine | Admitting: Internal Medicine

## 2014-08-29 DIAGNOSIS — E0590 Thyrotoxicosis, unspecified without thyrotoxic crisis or storm: Secondary | ICD-10-CM | POA: Diagnosis not present

## 2014-08-29 MED ORDER — SODIUM PERTECHNETATE TC 99M INJECTION
10.0000 | Freq: Once | INTRAVENOUS | Status: AC | PRN
  Administered 2014-08-29: 10 via INTRAVENOUS

## 2014-08-30 DIAGNOSIS — I10 Essential (primary) hypertension: Secondary | ICD-10-CM | POA: Diagnosis not present

## 2014-08-30 DIAGNOSIS — I517 Cardiomegaly: Secondary | ICD-10-CM | POA: Diagnosis not present

## 2014-09-06 ENCOUNTER — Other Ambulatory Visit: Payer: Self-pay | Admitting: Internal Medicine

## 2014-09-06 DIAGNOSIS — E041 Nontoxic single thyroid nodule: Secondary | ICD-10-CM

## 2014-09-06 DIAGNOSIS — E0500 Thyrotoxicosis with diffuse goiter without thyrotoxic crisis or storm: Secondary | ICD-10-CM | POA: Diagnosis not present

## 2014-09-06 DIAGNOSIS — E0520 Thyrotoxicosis with toxic multinodular goiter without thyrotoxic crisis or storm: Secondary | ICD-10-CM | POA: Diagnosis not present

## 2014-09-06 DIAGNOSIS — E069 Thyroiditis, unspecified: Secondary | ICD-10-CM | POA: Diagnosis not present

## 2014-09-11 DIAGNOSIS — R1310 Dysphagia, unspecified: Secondary | ICD-10-CM | POA: Diagnosis not present

## 2014-09-11 DIAGNOSIS — R634 Abnormal weight loss: Secondary | ICD-10-CM | POA: Diagnosis not present

## 2014-09-11 DIAGNOSIS — E0590 Thyrotoxicosis, unspecified without thyrotoxic crisis or storm: Secondary | ICD-10-CM | POA: Diagnosis not present

## 2014-09-12 DIAGNOSIS — E559 Vitamin D deficiency, unspecified: Secondary | ICD-10-CM | POA: Diagnosis not present

## 2014-09-12 DIAGNOSIS — J302 Other seasonal allergic rhinitis: Secondary | ICD-10-CM | POA: Diagnosis not present

## 2014-09-12 DIAGNOSIS — J45909 Unspecified asthma, uncomplicated: Secondary | ICD-10-CM | POA: Diagnosis not present

## 2014-09-12 DIAGNOSIS — E538 Deficiency of other specified B group vitamins: Secondary | ICD-10-CM | POA: Diagnosis not present

## 2014-09-12 DIAGNOSIS — E0590 Thyrotoxicosis, unspecified without thyrotoxic crisis or storm: Secondary | ICD-10-CM | POA: Diagnosis not present

## 2014-09-12 DIAGNOSIS — I10 Essential (primary) hypertension: Secondary | ICD-10-CM | POA: Diagnosis not present

## 2014-09-12 DIAGNOSIS — M545 Low back pain: Secondary | ICD-10-CM | POA: Diagnosis not present

## 2014-09-12 DIAGNOSIS — D860 Sarcoidosis of lung: Secondary | ICD-10-CM | POA: Diagnosis not present

## 2014-09-13 ENCOUNTER — Ambulatory Visit (HOSPITAL_COMMUNITY): Payer: Medicare Other

## 2014-09-19 DIAGNOSIS — R634 Abnormal weight loss: Secondary | ICD-10-CM | POA: Diagnosis not present

## 2014-09-19 DIAGNOSIS — R1310 Dysphagia, unspecified: Secondary | ICD-10-CM | POA: Diagnosis not present

## 2014-09-25 ENCOUNTER — Other Ambulatory Visit: Payer: Self-pay | Admitting: Internal Medicine

## 2014-09-25 ENCOUNTER — Ambulatory Visit (HOSPITAL_COMMUNITY): Admission: RE | Admit: 2014-09-25 | Payer: Medicare Other | Source: Ambulatory Visit

## 2014-09-25 DIAGNOSIS — E041 Nontoxic single thyroid nodule: Secondary | ICD-10-CM

## 2014-09-29 ENCOUNTER — Ambulatory Visit (HOSPITAL_COMMUNITY)
Admission: RE | Admit: 2014-09-29 | Discharge: 2014-09-29 | Disposition: A | Discharge status: Home / Self Care | Payer: Medicare Other | Source: Ambulatory Visit | Attending: Internal Medicine | Admitting: Internal Medicine

## 2014-09-29 DIAGNOSIS — R221 Localized swelling, mass and lump, neck: Secondary | ICD-10-CM | POA: Insufficient documentation

## 2014-09-29 DIAGNOSIS — E041 Nontoxic single thyroid nodule: Secondary | ICD-10-CM | POA: Diagnosis present

## 2014-10-04 DIAGNOSIS — E0500 Thyrotoxicosis with diffuse goiter without thyrotoxic crisis or storm: Secondary | ICD-10-CM | POA: Diagnosis not present

## 2014-10-07 DIAGNOSIS — J00 Acute nasopharyngitis [common cold]: Secondary | ICD-10-CM | POA: Diagnosis not present

## 2014-10-17 ENCOUNTER — Other Ambulatory Visit: Payer: Self-pay

## 2014-10-17 DIAGNOSIS — E041 Nontoxic single thyroid nodule: Secondary | ICD-10-CM | POA: Diagnosis not present

## 2014-10-17 NOTE — Addendum Note (Signed)
Addended by: Odis Hollingshead on: 10/17/2014 10:58 AM   Modules accepted: Orders

## 2014-10-18 ENCOUNTER — Other Ambulatory Visit: Payer: Self-pay

## 2014-10-24 ENCOUNTER — Other Ambulatory Visit: Payer: Self-pay | Admitting: General Surgery

## 2014-10-24 DIAGNOSIS — E041 Nontoxic single thyroid nodule: Secondary | ICD-10-CM

## 2014-10-25 ENCOUNTER — Other Ambulatory Visit: Payer: Medicare Other

## 2014-10-25 ENCOUNTER — Other Ambulatory Visit (HOSPITAL_COMMUNITY)
Admission: RE | Admit: 2014-10-25 | Discharge: 2014-10-25 | Disposition: A | Discharge status: Home / Self Care | Payer: Medicare Other | Source: Ambulatory Visit | Attending: Interventional Radiology | Admitting: Interventional Radiology

## 2014-10-25 ENCOUNTER — Ambulatory Visit
Admission: RE | Admit: 2014-10-25 | Discharge: 2014-10-25 | Disposition: A | Discharge status: Home / Self Care | Payer: Medicare Other | Source: Ambulatory Visit | Attending: General Surgery | Admitting: General Surgery

## 2014-10-25 DIAGNOSIS — E041 Nontoxic single thyroid nodule: Secondary | ICD-10-CM | POA: Insufficient documentation

## 2014-10-25 DIAGNOSIS — E0789 Other specified disorders of thyroid: Secondary | ICD-10-CM | POA: Diagnosis not present

## 2014-12-14 DIAGNOSIS — E538 Deficiency of other specified B group vitamins: Secondary | ICD-10-CM | POA: Diagnosis not present

## 2014-12-14 DIAGNOSIS — K30 Functional dyspepsia: Secondary | ICD-10-CM | POA: Diagnosis not present

## 2014-12-14 DIAGNOSIS — J302 Other seasonal allergic rhinitis: Secondary | ICD-10-CM | POA: Diagnosis not present

## 2014-12-14 DIAGNOSIS — D860 Sarcoidosis of lung: Secondary | ICD-10-CM | POA: Diagnosis not present

## 2014-12-14 DIAGNOSIS — E559 Vitamin D deficiency, unspecified: Secondary | ICD-10-CM | POA: Diagnosis not present

## 2014-12-14 DIAGNOSIS — I10 Essential (primary) hypertension: Secondary | ICD-10-CM | POA: Diagnosis not present

## 2014-12-14 DIAGNOSIS — E0590 Thyrotoxicosis, unspecified without thyrotoxic crisis or storm: Secondary | ICD-10-CM | POA: Diagnosis not present

## 2014-12-14 DIAGNOSIS — J45909 Unspecified asthma, uncomplicated: Secondary | ICD-10-CM | POA: Diagnosis not present

## 2014-12-18 DIAGNOSIS — E538 Deficiency of other specified B group vitamins: Secondary | ICD-10-CM | POA: Diagnosis not present

## 2014-12-18 DIAGNOSIS — J301 Allergic rhinitis due to pollen: Secondary | ICD-10-CM | POA: Diagnosis not present

## 2014-12-18 DIAGNOSIS — I10 Essential (primary) hypertension: Secondary | ICD-10-CM | POA: Diagnosis not present

## 2014-12-18 DIAGNOSIS — D649 Anemia, unspecified: Secondary | ICD-10-CM | POA: Diagnosis not present

## 2014-12-18 DIAGNOSIS — Z131 Encounter for screening for diabetes mellitus: Secondary | ICD-10-CM | POA: Diagnosis not present

## 2014-12-18 DIAGNOSIS — E559 Vitamin D deficiency, unspecified: Secondary | ICD-10-CM | POA: Diagnosis not present

## 2014-12-18 DIAGNOSIS — K30 Functional dyspepsia: Secondary | ICD-10-CM | POA: Diagnosis not present

## 2014-12-18 DIAGNOSIS — J3081 Allergic rhinitis due to animal (cat) (dog) hair and dander: Secondary | ICD-10-CM | POA: Diagnosis not present

## 2014-12-18 DIAGNOSIS — Z136 Encounter for screening for cardiovascular disorders: Secondary | ICD-10-CM | POA: Diagnosis not present

## 2014-12-18 DIAGNOSIS — E0590 Thyrotoxicosis, unspecified without thyrotoxic crisis or storm: Secondary | ICD-10-CM | POA: Diagnosis not present

## 2014-12-18 DIAGNOSIS — D860 Sarcoidosis of lung: Secondary | ICD-10-CM | POA: Diagnosis not present

## 2014-12-18 DIAGNOSIS — J302 Other seasonal allergic rhinitis: Secondary | ICD-10-CM | POA: Diagnosis not present

## 2014-12-18 DIAGNOSIS — J45909 Unspecified asthma, uncomplicated: Secondary | ICD-10-CM | POA: Diagnosis not present

## 2015-01-11 DIAGNOSIS — R21 Rash and other nonspecific skin eruption: Secondary | ICD-10-CM | POA: Diagnosis not present

## 2015-01-11 DIAGNOSIS — E559 Vitamin D deficiency, unspecified: Secondary | ICD-10-CM | POA: Diagnosis not present

## 2015-01-11 DIAGNOSIS — J45909 Unspecified asthma, uncomplicated: Secondary | ICD-10-CM | POA: Diagnosis not present

## 2015-01-11 DIAGNOSIS — K30 Functional dyspepsia: Secondary | ICD-10-CM | POA: Diagnosis not present

## 2015-01-11 DIAGNOSIS — D860 Sarcoidosis of lung: Secondary | ICD-10-CM | POA: Diagnosis not present

## 2015-01-11 DIAGNOSIS — I10 Essential (primary) hypertension: Secondary | ICD-10-CM | POA: Diagnosis not present

## 2015-01-11 DIAGNOSIS — E538 Deficiency of other specified B group vitamins: Secondary | ICD-10-CM | POA: Diagnosis not present

## 2015-01-11 DIAGNOSIS — J302 Other seasonal allergic rhinitis: Secondary | ICD-10-CM | POA: Diagnosis not present

## 2015-04-03 ENCOUNTER — Emergency Department (HOSPITAL_COMMUNITY)
Admission: EM | Admit: 2015-04-03 | Discharge: 2015-04-03 | Discharge status: Against Medical Advice | Payer: Medicare Other | Attending: Emergency Medicine | Admitting: Emergency Medicine

## 2015-04-03 ENCOUNTER — Encounter (HOSPITAL_COMMUNITY): Payer: Self-pay | Admitting: *Deleted

## 2015-04-03 DIAGNOSIS — J45909 Unspecified asthma, uncomplicated: Secondary | ICD-10-CM | POA: Insufficient documentation

## 2015-04-03 DIAGNOSIS — R011 Cardiac murmur, unspecified: Secondary | ICD-10-CM | POA: Diagnosis not present

## 2015-04-03 DIAGNOSIS — R0989 Other specified symptoms and signs involving the circulatory and respiratory systems: Secondary | ICD-10-CM | POA: Diagnosis not present

## 2015-04-03 DIAGNOSIS — I10 Essential (primary) hypertension: Secondary | ICD-10-CM | POA: Diagnosis not present

## 2015-04-03 NOTE — ED Notes (Signed)
The pt has had a piece of bacon in her throat for one week.  She has tried to flush it out but she still feels the scratching in her throat and wants it removed  lmp 3 days ago

## 2015-04-05 ENCOUNTER — Emergency Department (HOSPITAL_COMMUNITY): Payer: Medicare Other

## 2015-04-05 ENCOUNTER — Encounter: Payer: Self-pay | Admitting: Physician Assistant

## 2015-04-05 ENCOUNTER — Emergency Department (HOSPITAL_COMMUNITY)
Admission: EM | Admit: 2015-04-05 | Discharge: 2015-04-05 | Disposition: A | Discharge status: Home / Self Care | Payer: Medicare Other | Attending: Emergency Medicine | Admitting: Emergency Medicine

## 2015-04-05 ENCOUNTER — Encounter (HOSPITAL_COMMUNITY): Payer: Self-pay | Admitting: Emergency Medicine

## 2015-04-05 DIAGNOSIS — Z7951 Long term (current) use of inhaled steroids: Secondary | ICD-10-CM | POA: Insufficient documentation

## 2015-04-05 DIAGNOSIS — J392 Other diseases of pharynx: Secondary | ICD-10-CM | POA: Diagnosis not present

## 2015-04-05 DIAGNOSIS — R011 Cardiac murmur, unspecified: Secondary | ICD-10-CM | POA: Insufficient documentation

## 2015-04-05 DIAGNOSIS — Z862 Personal history of diseases of the blood and blood-forming organs and certain disorders involving the immune mechanism: Secondary | ICD-10-CM | POA: Diagnosis not present

## 2015-04-05 DIAGNOSIS — Z79899 Other long term (current) drug therapy: Secondary | ICD-10-CM | POA: Insufficient documentation

## 2015-04-05 DIAGNOSIS — Z7952 Long term (current) use of systemic steroids: Secondary | ICD-10-CM | POA: Insufficient documentation

## 2015-04-05 DIAGNOSIS — Z8639 Personal history of other endocrine, nutritional and metabolic disease: Secondary | ICD-10-CM | POA: Diagnosis not present

## 2015-04-05 DIAGNOSIS — R0989 Other specified symptoms and signs involving the circulatory and respiratory systems: Secondary | ICD-10-CM | POA: Insufficient documentation

## 2015-04-05 DIAGNOSIS — I10 Essential (primary) hypertension: Secondary | ICD-10-CM | POA: Insufficient documentation

## 2015-04-05 DIAGNOSIS — R05 Cough: Secondary | ICD-10-CM | POA: Diagnosis not present

## 2015-04-05 DIAGNOSIS — J45909 Unspecified asthma, uncomplicated: Secondary | ICD-10-CM | POA: Insufficient documentation

## 2015-04-05 DIAGNOSIS — K219 Gastro-esophageal reflux disease without esophagitis: Secondary | ICD-10-CM | POA: Insufficient documentation

## 2015-04-05 MED ORDER — LIDOCAINE VISCOUS 2 % MT SOLN: 20.0000 mL | OROMUCOSAL | Status: DC | PRN

## 2015-04-05 NOTE — Discharge Instructions (Signed)
You have an appointment at [place] on Tuesday at 1:30. If you have a piece of Berniece Salines stuck in your throat, this is the doctor who will be able to remove the piece of Berniece Salines. Please make an appointment with the Ear, Nose, and throat doctor. They may be able to help you with your goiter, difficulty swallowing, and possible diagnosis or reflux. Please return to the emergency department for any of the reason Dr. Colin Rhein discussed with you: fever, chills, severe difficulty breathing, unable to swallow liquids. The viscous lidocaine can be swallowed and will numb the esophagus. This may give you some relief of the sensation in your throat. Your chest xray was negative

## 2015-04-05 NOTE — ED Provider Notes (Signed)
CSN: [id number]     Arrival date & time 04/05/15  1005 History   First MD Initiated Contact with Patient 04/05/15 1010     Chief Complaint  Patient presents with  . Swallowed Foreign Body     (Consider location/radiation/quality/duration/timing/severity/associated sxs/prior Treatment) HPI  Rebecca Sanchez Is a 44 year old female who presents emergency Department with chief complaint of foreign body sensation in throat. I have seen this patient previously for similar complaint in March. She has a known enlarged thyroid with goiters. Patient states that she swallowed a piece of bacon 3 days ago and feels acuteness stuck in her throat. She states that his pain making her gag. She has been able to eat and drink fluids normally. She is able to speak in full sentences without stridor. She endorses some difficulty swallowing. She denies cough, shortness of breath. Past Medical History  Diagnosis Date  . Chronic bronchitis   . Sarcoid ([place])   . HTN (hypertension)   . Asthma   . Thyromegaly   . Chest pain     a. 04/2012 Echo: EF 65%, nl wall motion.  . Anemia   . GERD (gastroesophageal reflux disease)   . Heart murmur    Past Surgical History  Procedure Laterality Date  . Hernia repair    . Bunionectomy     Family History  Problem Relation Age of Onset  . Cancer Maternal Grandmother     lung  . Cancer Paternal Grandmother     lung  . Hypertension Mother   . Hypertension Father    Social History  Substance Use Topics  . Smoking status: Never Smoker   . Smokeless tobacco: Never Used  . Alcohol Use: No   OB History    Gravida Para Term Preterm AB TAB SAB Ectopic Multiple Living   4 3 3  1  1         Review of Systems  HENT: Positive for trouble swallowing. Negative for drooling and voice change.   Respiratory: Negative for cough, choking, wheezing and stridor.   All other systems reviewed and are negative.  Ten systems reviewed and are negative for acute change, except  as noted in the HPI.    Allergies  Azithromycin; Motrin; Peanut-containing drug products; and Atenolol  Home Medications   Prior to Admission medications   Medication Sig Start Date End Date Taking? Authorizing Provider  albuterol (PROAIR HFA) 108 (90 BASE) MCG/ACT inhaler Inhale 2 puffs into the lungs every 6 (six) hours as needed for wheezing or shortness of breath.     Historical Provider, MD  amoxicillin (AMOXIL) 500 MG capsule Take 4 capsules (2,000 mg total) by mouth once. Patient not taking: Reported on 07/23/2014 07/18/14   Shelly Bombard, MD  diphenhydrAMINE (BENADRYL) 25 MG tablet Take 1 tablet (25 mg total) by mouth every 6 (six) hours. 08/07/14   Hanna Patel-Mills, PA-C  famotidine (PEPCID) 20 MG tablet Take 1 tablet (20 mg total) by mouth 2 (two) times daily. Patient not taking: Reported on 06/09/2014 05/27/14   Charlesetta Shanks, MD  fluticasone [facility]) 50 MCG/ACT nasal spray Place 2 sprays into both nostrils 2 (two) times daily as needed for allergies.     Historical Provider, MD  Oxycodone HCl 10 MG TABS Take 1 tablet (10 mg total) by mouth every 6 (six) hours as needed. Patient not taking: Reported on 07/18/2014 06/23/14   Shelly Bombard, MD  predniSONE (STERAPRED UNI-PAK) 10 MG tablet Take by mouth taper from 4 doses each  day to 1 dose and stop. 08/07/14   Hanna Patel-Mills, PA-C   BP 160/96 mmHg  Pulse 86  Temp(Src) 98.3 F (36.8 C) (Oral)  Resp 16  Ht 5\' 3"  (1.6 m)  Wt 109 lb (49.442 kg)  BMI 19.31 kg/m2  SpO2 100%  LMP 03/31/2015 Physical Exam  Constitutional: She is oriented to person, place, and time. She appears well-developed and well-nourished. No distress.  HENT:  Head: Normocephalic and atraumatic.  Mouth/Throat: Oropharynx is clear and moist. No oropharyngeal exudate.  Eyes: Conjunctivae are normal. No scleral icterus.  Neck: Normal range of motion.  Cardiovascular: Normal rate, regular rhythm and normal heart sounds.  Exam reveals no gallop and no  friction rub.   No murmur heard. Pulmonary/Chest: Effort normal and breath sounds normal. No respiratory distress. She has no wheezes.  Abdominal: Soft. Bowel sounds are normal. She exhibits no distension and no mass. There is no tenderness. There is no guarding.  Neurological: She is alert and oriented to person, place, and time.  Skin: Skin is warm and dry. She is not diaphoretic.  Nursing note and vitals reviewed.   ED Course  Procedures (including critical care time) Labs Review Labs Reviewed - No data to display  Imaging Review No results found. I have personally reviewed and evaluated these images and lab results as part of my medical decision-making.   EKG Interpretation None      MDM   Final diagnoses:  Foreign body sensation in throat    10:36 AM BP 160/96 mmHg  Pulse 86  Temp(Src) 98.3 F (36.8 C) (Oral)  Resp 16  Ht 5\' 3"  (1.6 m)  Wt 109 lb (49.442 kg)  BMI 19.31 kg/m2  SpO2 100%  LMP 03/31/2015 Patient with hypertension. No visible foreign body. Tolerating fluids. Will contact gastroenterology.  Patient seen in shared visit with Dr. Colin Rhein Chest x-ray is negative. The patient will be followed up with gastroenterology next Tuesday 10:15 at 1:30 PM. She will also be given. Ears, nose and throat consult given her history of goiter. Patient is able to tolerate fluids in the emergency department. We'll also discharge with viscous lidocaine for her discomfort. She appears safe for discharge at this time.  Margarita Mail, PA-C 04/05/15 2054  Debby Freiberg, MD 04/06/15 [phone]

## 2015-04-05 NOTE — ED Notes (Signed)
ED Doctor and PA at bedside.

## 2015-04-05 NOTE — ED Notes (Signed)
Ate bacon Oct 30th since then feels bacon stuck in throat. Has hyperthyroid and enlarged goiter. Airway intact bilateral equal chest rise and fall.

## 2015-04-06 DIAGNOSIS — E0590 Thyrotoxicosis, unspecified without thyrotoxic crisis or storm: Secondary | ICD-10-CM | POA: Diagnosis not present

## 2015-04-06 DIAGNOSIS — Z131 Encounter for screening for diabetes mellitus: Secondary | ICD-10-CM | POA: Diagnosis not present

## 2015-04-06 DIAGNOSIS — D860 Sarcoidosis of lung: Secondary | ICD-10-CM | POA: Diagnosis not present

## 2015-04-06 DIAGNOSIS — K30 Functional dyspepsia: Secondary | ICD-10-CM | POA: Diagnosis not present

## 2015-04-06 DIAGNOSIS — J302 Other seasonal allergic rhinitis: Secondary | ICD-10-CM | POA: Diagnosis not present

## 2015-04-06 DIAGNOSIS — Z136 Encounter for screening for cardiovascular disorders: Secondary | ICD-10-CM | POA: Diagnosis not present

## 2015-04-06 DIAGNOSIS — E559 Vitamin D deficiency, unspecified: Secondary | ICD-10-CM | POA: Diagnosis not present

## 2015-04-06 DIAGNOSIS — J45909 Unspecified asthma, uncomplicated: Secondary | ICD-10-CM | POA: Diagnosis not present

## 2015-04-06 DIAGNOSIS — I10 Essential (primary) hypertension: Secondary | ICD-10-CM | POA: Diagnosis not present

## 2015-04-06 DIAGNOSIS — D649 Anemia, unspecified: Secondary | ICD-10-CM | POA: Diagnosis not present

## 2015-04-06 DIAGNOSIS — E538 Deficiency of other specified B group vitamins: Secondary | ICD-10-CM | POA: Diagnosis not present

## 2015-04-10 ENCOUNTER — Ambulatory Visit: Payer: Medicare Other | Admitting: Physician Assistant

## 2015-04-16 DIAGNOSIS — E559 Vitamin D deficiency, unspecified: Secondary | ICD-10-CM | POA: Diagnosis not present

## 2015-04-16 DIAGNOSIS — K30 Functional dyspepsia: Secondary | ICD-10-CM | POA: Diagnosis not present

## 2015-04-16 DIAGNOSIS — Z0000 Encounter for general adult medical examination without abnormal findings: Secondary | ICD-10-CM | POA: Diagnosis not present

## 2015-04-16 DIAGNOSIS — Z131 Encounter for screening for diabetes mellitus: Secondary | ICD-10-CM | POA: Diagnosis not present

## 2015-04-16 DIAGNOSIS — Z136 Encounter for screening for cardiovascular disorders: Secondary | ICD-10-CM | POA: Diagnosis not present

## 2015-04-16 DIAGNOSIS — D860 Sarcoidosis of lung: Secondary | ICD-10-CM | POA: Diagnosis not present

## 2015-04-16 DIAGNOSIS — J45909 Unspecified asthma, uncomplicated: Secondary | ICD-10-CM | POA: Diagnosis not present

## 2015-04-16 DIAGNOSIS — Z1389 Encounter for screening for other disorder: Secondary | ICD-10-CM | POA: Diagnosis not present

## 2015-04-16 DIAGNOSIS — J302 Other seasonal allergic rhinitis: Secondary | ICD-10-CM | POA: Diagnosis not present

## 2015-04-16 DIAGNOSIS — Z0100 Encounter for examination of eyes and vision without abnormal findings: Secondary | ICD-10-CM | POA: Diagnosis not present

## 2015-04-16 DIAGNOSIS — I10 Essential (primary) hypertension: Secondary | ICD-10-CM | POA: Diagnosis not present

## 2015-04-16 DIAGNOSIS — D649 Anemia, unspecified: Secondary | ICD-10-CM | POA: Diagnosis not present

## 2015-04-16 DIAGNOSIS — Z01118 Encounter for examination of ears and hearing with other abnormal findings: Secondary | ICD-10-CM | POA: Diagnosis not present

## 2015-04-16 DIAGNOSIS — E0590 Thyrotoxicosis, unspecified without thyrotoxic crisis or storm: Secondary | ICD-10-CM | POA: Diagnosis not present

## 2015-04-16 DIAGNOSIS — E538 Deficiency of other specified B group vitamins: Secondary | ICD-10-CM | POA: Diagnosis not present

## 2015-05-23 ENCOUNTER — Ambulatory Visit: Payer: Self-pay | Admitting: Surgery

## 2015-05-23 DIAGNOSIS — E041 Nontoxic single thyroid nodule: Secondary | ICD-10-CM | POA: Diagnosis not present

## 2015-06-10 DIAGNOSIS — Z886 Allergy status to analgesic agent status: Secondary | ICD-10-CM | POA: Diagnosis not present

## 2015-06-10 DIAGNOSIS — E010 Iodine-deficiency related diffuse (endemic) goiter: Secondary | ICD-10-CM | POA: Diagnosis not present

## 2015-06-10 DIAGNOSIS — Z881 Allergy status to other antibiotic agents status: Secondary | ICD-10-CM | POA: Diagnosis not present

## 2015-06-10 DIAGNOSIS — D869 Sarcoidosis, unspecified: Secondary | ICD-10-CM | POA: Diagnosis not present

## 2015-06-10 DIAGNOSIS — R0989 Other specified symptoms and signs involving the circulatory and respiratory systems: Secondary | ICD-10-CM | POA: Diagnosis not present

## 2015-06-10 DIAGNOSIS — R1310 Dysphagia, unspecified: Secondary | ICD-10-CM | POA: Diagnosis not present

## 2015-06-10 DIAGNOSIS — Z91010 Allergy to peanuts: Secondary | ICD-10-CM | POA: Diagnosis not present

## 2015-06-10 DIAGNOSIS — Z888 Allergy status to other drugs, medicaments and biological substances status: Secondary | ICD-10-CM | POA: Diagnosis not present

## 2015-06-14 DIAGNOSIS — E0500 Thyrotoxicosis with diffuse goiter without thyrotoxic crisis or storm: Secondary | ICD-10-CM | POA: Diagnosis not present

## 2015-06-29 DIAGNOSIS — F458 Other somatoform disorders: Secondary | ICD-10-CM | POA: Diagnosis not present

## 2015-06-29 DIAGNOSIS — E041 Nontoxic single thyroid nodule: Secondary | ICD-10-CM | POA: Diagnosis not present

## 2015-06-29 DIAGNOSIS — Z7951 Long term (current) use of inhaled steroids: Secondary | ICD-10-CM | POA: Diagnosis not present

## 2015-06-29 DIAGNOSIS — D860 Sarcoidosis of lung: Secondary | ICD-10-CM | POA: Diagnosis not present

## 2015-06-29 DIAGNOSIS — Z833 Family history of diabetes mellitus: Secondary | ICD-10-CM | POA: Diagnosis not present

## 2015-06-29 DIAGNOSIS — Z91010 Allergy to peanuts: Secondary | ICD-10-CM | POA: Diagnosis not present

## 2015-06-29 DIAGNOSIS — Z79899 Other long term (current) drug therapy: Secondary | ICD-10-CM | POA: Diagnosis not present

## 2015-06-29 DIAGNOSIS — R1310 Dysphagia, unspecified: Secondary | ICD-10-CM | POA: Diagnosis not present

## 2015-06-29 DIAGNOSIS — Z9889 Other specified postprocedural states: Secondary | ICD-10-CM | POA: Diagnosis not present

## 2015-06-29 DIAGNOSIS — E042 Nontoxic multinodular goiter: Secondary | ICD-10-CM | POA: Diagnosis not present

## 2015-06-29 DIAGNOSIS — Z888 Allergy status to other drugs, medicaments and biological substances status: Secondary | ICD-10-CM | POA: Diagnosis not present

## 2015-06-29 DIAGNOSIS — Z8249 Family history of ischemic heart disease and other diseases of the circulatory system: Secondary | ICD-10-CM | POA: Diagnosis not present

## 2015-06-29 DIAGNOSIS — R634 Abnormal weight loss: Secondary | ICD-10-CM | POA: Diagnosis not present

## 2015-07-11 DIAGNOSIS — E041 Nontoxic single thyroid nodule: Secondary | ICD-10-CM | POA: Diagnosis not present

## 2015-07-16 DIAGNOSIS — J028 Acute pharyngitis due to other specified organisms: Secondary | ICD-10-CM | POA: Diagnosis not present

## 2015-07-16 DIAGNOSIS — B9789 Other viral agents as the cause of diseases classified elsewhere: Secondary | ICD-10-CM | POA: Diagnosis not present

## 2015-07-16 DIAGNOSIS — B372 Candidiasis of skin and nail: Secondary | ICD-10-CM | POA: Diagnosis not present

## 2015-09-06 DIAGNOSIS — N63 Unspecified lump in breast: Secondary | ICD-10-CM | POA: Diagnosis not present

## 2015-09-06 DIAGNOSIS — N6452 Nipple discharge: Secondary | ICD-10-CM | POA: Diagnosis not present

## 2015-09-06 DIAGNOSIS — N6002 Solitary cyst of left breast: Secondary | ICD-10-CM | POA: Diagnosis not present

## 2015-09-12 ENCOUNTER — Other Ambulatory Visit: Payer: Self-pay | Admitting: Radiology

## 2015-09-12 DIAGNOSIS — N6002 Solitary cyst of left breast: Secondary | ICD-10-CM | POA: Diagnosis not present

## 2015-09-12 DIAGNOSIS — N6452 Nipple discharge: Secondary | ICD-10-CM | POA: Diagnosis not present

## 2015-09-18 DIAGNOSIS — N63 Unspecified lump in breast: Secondary | ICD-10-CM | POA: Diagnosis not present

## 2015-09-18 DIAGNOSIS — N6452 Nipple discharge: Secondary | ICD-10-CM | POA: Diagnosis not present

## 2015-09-18 DIAGNOSIS — N644 Mastodynia: Secondary | ICD-10-CM | POA: Diagnosis not present

## 2015-10-18 DIAGNOSIS — D869 Sarcoidosis, unspecified: Secondary | ICD-10-CM | POA: Diagnosis not present

## 2015-10-18 DIAGNOSIS — E039 Hypothyroidism, unspecified: Secondary | ICD-10-CM | POA: Diagnosis not present

## 2015-10-18 DIAGNOSIS — R1310 Dysphagia, unspecified: Secondary | ICD-10-CM | POA: Diagnosis not present

## 2015-10-18 DIAGNOSIS — Z79899 Other long term (current) drug therapy: Secondary | ICD-10-CM | POA: Diagnosis not present

## 2015-10-18 DIAGNOSIS — F458 Other somatoform disorders: Secondary | ICD-10-CM | POA: Diagnosis not present

## 2015-10-18 DIAGNOSIS — E041 Nontoxic single thyroid nodule: Secondary | ICD-10-CM | POA: Diagnosis not present

## 2015-10-18 DIAGNOSIS — Z881 Allergy status to other antibiotic agents status: Secondary | ICD-10-CM | POA: Diagnosis not present

## 2015-10-18 DIAGNOSIS — Z7951 Long term (current) use of inhaled steroids: Secondary | ICD-10-CM | POA: Diagnosis not present

## 2015-10-18 DIAGNOSIS — Z91010 Allergy to peanuts: Secondary | ICD-10-CM | POA: Diagnosis not present

## 2015-10-18 DIAGNOSIS — Z888 Allergy status to other drugs, medicaments and biological substances status: Secondary | ICD-10-CM | POA: Diagnosis not present

## 2015-10-18 DIAGNOSIS — E042 Nontoxic multinodular goiter: Secondary | ICD-10-CM | POA: Diagnosis not present

## 2015-10-18 DIAGNOSIS — E0590 Thyrotoxicosis, unspecified without thyrotoxic crisis or storm: Secondary | ICD-10-CM | POA: Diagnosis not present

## 2015-10-18 DIAGNOSIS — R634 Abnormal weight loss: Secondary | ICD-10-CM | POA: Diagnosis not present

## 2015-10-28 ENCOUNTER — Emergency Department (HOSPITAL_COMMUNITY)
Admission: EM | Admit: 2015-10-28 | Discharge: 2015-10-29 | Disposition: A | Discharge status: Home / Self Care | Payer: Medicare Other | Attending: Emergency Medicine | Admitting: Emergency Medicine

## 2015-10-28 ENCOUNTER — Encounter (HOSPITAL_COMMUNITY): Payer: Self-pay | Admitting: Emergency Medicine

## 2015-10-28 DIAGNOSIS — Z792 Long term (current) use of antibiotics: Secondary | ICD-10-CM | POA: Insufficient documentation

## 2015-10-28 DIAGNOSIS — J45909 Unspecified asthma, uncomplicated: Secondary | ICD-10-CM | POA: Diagnosis not present

## 2015-10-28 DIAGNOSIS — R1310 Dysphagia, unspecified: Secondary | ICD-10-CM

## 2015-10-28 DIAGNOSIS — R1319 Other dysphagia: Secondary | ICD-10-CM | POA: Diagnosis not present

## 2015-10-28 DIAGNOSIS — Z79891 Long term (current) use of opiate analgesic: Secondary | ICD-10-CM | POA: Diagnosis not present

## 2015-10-28 DIAGNOSIS — Z91010 Allergy to peanuts: Secondary | ICD-10-CM | POA: Insufficient documentation

## 2015-10-28 DIAGNOSIS — I10 Essential (primary) hypertension: Secondary | ICD-10-CM | POA: Diagnosis not present

## 2015-10-28 DIAGNOSIS — Z7952 Long term (current) use of systemic steroids: Secondary | ICD-10-CM | POA: Diagnosis not present

## 2015-10-28 DIAGNOSIS — E049 Nontoxic goiter, unspecified: Secondary | ICD-10-CM | POA: Diagnosis present

## 2015-10-28 DIAGNOSIS — Z79899 Other long term (current) drug therapy: Secondary | ICD-10-CM | POA: Diagnosis not present

## 2015-10-28 NOTE — ED Notes (Addendum)
Pt has a history of thyroid disease. Pt states she has a goiter, pt thinks she has some of a smoothie stuck in her throat due to her goiter. Pt states she is feeling jittery. Pt is scheduled for thyroid removal on July 5th. Pt is hypertensive at time of assessment. Pt states she has also had a thyroid storm before. Pt denies confusion or alterations in body temperature at this time. Pt has diminished lung sounds on the right side

## 2015-10-29 NOTE — ED Provider Notes (Signed)
CSN: [id number]     Arrival date & time 10/28/15  2323 History  By signing my name below, I, [facility], attest that this documentation has been prepared under the direction and in the presence of Merryl Hacker, MD. Electronically Signed: Virgel Bouquet, ED Scribe. 10/29/2015. 1:11 AM.   Chief Complaint  Patient presents with  . Goiter    The history is provided by the patient. No language interpreter was used.   HPI Comments: Rebecca Sanchez is a 45 y.o. female with an hx of HTN, GERD, thyromegaly who presents to the Emergency Department complaining of a possible foreign body stuck in her throat that occurred earlier this evening shortly PTA. Pt states that she was drinking a smoothie with chunks of kiwi and banana in it earlier tonight and believes that it may have gotten stuck in her throat due to her goiter. She reports mild SOB. She has been able to swallow her saliva with difficulty. Per pt, she is scheduled to have her goiter surgically removed next month. Denies sore throat, any pain, or any other complaints currently.  Past Medical History  Diagnosis Date  . Chronic bronchitis   . Sarcoid ([place])   . HTN (hypertension)   . Asthma   . Thyromegaly   . Chest pain     a. 04/2012 Echo: EF 65%, nl wall motion.  . Anemia   . GERD (gastroesophageal reflux disease)   . Heart murmur    Past Surgical History  Procedure Laterality Date  . Hernia repair    . Bunionectomy     Family History  Problem Relation Age of Onset  . Cancer Maternal Grandmother     lung  . Cancer Paternal Grandmother     lung  . Hypertension Mother   . Hypertension Father    Social History  Substance Use Topics  . Smoking status: Never Smoker   . Smokeless tobacco: Never Used  . Alcohol Use: No   OB History    Gravida Para Term Preterm AB TAB SAB Ectopic Multiple Living   4 3 3  1  1         Review of Systems  Constitutional: Negative for fever.  HENT: Negative for sore throat and  trouble swallowing.   Respiratory: Positive for shortness of breath.   All other systems reviewed and are negative.     Allergies  Atenolol; Azithromycin; Motrin; Peanut oil; Peanut-containing drug products; Propranolol; Tapazole; and Zyrtec  Home Medications   Prior to Admission medications   Medication Sig Start Date End Date Taking? Authorizing Provider  albuterol (PROAIR HFA) 108 (90 BASE) MCG/ACT inhaler Inhale 2 puffs into the lungs every 6 (six) hours as needed for wheezing or shortness of breath.    Yes Historical Provider, MD  Cholecalciferol (VITAMIN D-1000 MAX ST) 1000 units tablet Take 1 tablet by mouth daily.   Yes Historical Provider, MD  fluticasone (FLONASE) 50 MCG/ACT nasal spray Place 2 sprays into both nostrils 2 (two) times daily as needed for allergies.    Yes Historical Provider, MD  amoxicillin (AMOXIL) 500 MG capsule Take 4 capsules (2,000 mg total) by mouth once. Patient not taking: Reported on 07/23/2014 07/18/14   Shelly Bombard, MD  diphenhydrAMINE (BENADRYL) 25 MG tablet Take 1 tablet (25 mg total) by mouth every 6 (six) hours. Patient not taking: Reported on 04/05/2015 08/07/14   Ottie Glazier, PA-C  famotidine (PEPCID) 20 MG tablet Take 1 tablet (20 mg total) by mouth 2 (two) times  daily. Patient not taking: Reported on 06/09/2014 05/27/14   Charlesetta Shanks, MD  lidocaine (XYLOCAINE) 2 % solution Use as directed 20 mLs in the mouth or throat as needed for mouth pain. 04/05/15   Margarita Mail, PA-C  Oxycodone HCl 10 MG TABS Take 1 tablet (10 mg total) by mouth every 6 (six) hours as needed. Patient not taking: Reported on 07/18/2014 06/23/14   Shelly Bombard, MD  predniSONE (STERAPRED UNI-PAK) 10 MG tablet Take by mouth taper from 4 doses each day to 1 dose and stop. Patient not taking: Reported on 04/05/2015 08/07/14   Hanna Patel-Mills, PA-C   BP 143/88 mmHg  Pulse 74  Temp(Src) 98.2 F (36.8 C) (Oral)  Resp 14  SpO2 100% Physical Exam   Constitutional: She is oriented to person, place, and time. She appears well-developed and well-nourished. No distress.  HENT:  Head: Normocephalic and atraumatic.  Neck:  Goiter  Cardiovascular: Normal rate, regular rhythm and normal heart sounds.   Pulmonary/Chest: Effort normal and breath sounds normal. No stridor. No respiratory distress. She has no wheezes.  Neurological: She is alert and oriented to person, place, and time.  Skin: Skin is warm and dry.  Psychiatric: She has a normal mood and affect.  Nursing note and vitals reviewed.   ED Course  Procedures (including critical care time)  DIAGNOSTIC STUDIES: Oxygen Saturation is 100% on RA, normal by my interpretation.    COORDINATION OF CARE: 1:03 AM Will given pt water to perform P/O challenge. Discussed ordering numbing medication for her throat and pt declined. Will return to reassess pt. Discussed treatment plan with pt at bedside and pt agreed to plan.   Labs Review Labs Reviewed - No data to display  Imaging Review No results found. I have personally reviewed and evaluated these images and lab results as part of my medical decision-making.   EKG Interpretation None      MDM   Final diagnoses:  Trouble swallowing    Patient presents with dysphagia after drinking a smoothie. She felt like things got stuck. She cleared her throat in triage and feels somewhat better. She has been able to tolerate her secretions. She is declining any GI cocktail for topical anesthetic. She was able to tolerate fluids without difficulty. Blood pressure improved while in the emergency department. She's been hypertensive since her diagnosis and will have surgery in July.  After history, exam, and medical workup I feel the patient has been appropriately medically screened and is safe for discharge home. Pertinent diagnoses were discussed with the patient. Patient was given return precautions.  I personally performed the services  described in this documentation, which was scribed in my presence. The recorded information has been reviewed and is accurate.    Merryl Hacker, MD 10/29/15 [phone]

## 2015-10-29 NOTE — Discharge Instructions (Signed)

## 2015-10-29 NOTE — ED Notes (Signed)
Patient given water to drink per provider request. Patient tolerated well, no difficulty drinking.

## 2015-11-05 DIAGNOSIS — I10 Essential (primary) hypertension: Secondary | ICD-10-CM | POA: Diagnosis not present

## 2015-11-05 DIAGNOSIS — J302 Other seasonal allergic rhinitis: Secondary | ICD-10-CM | POA: Diagnosis not present

## 2015-11-05 DIAGNOSIS — E0590 Thyrotoxicosis, unspecified without thyrotoxic crisis or storm: Secondary | ICD-10-CM | POA: Diagnosis not present

## 2015-11-05 DIAGNOSIS — E538 Deficiency of other specified B group vitamins: Secondary | ICD-10-CM | POA: Diagnosis not present

## 2015-11-05 DIAGNOSIS — K30 Functional dyspepsia: Secondary | ICD-10-CM | POA: Diagnosis not present

## 2015-11-05 DIAGNOSIS — D860 Sarcoidosis of lung: Secondary | ICD-10-CM | POA: Diagnosis not present

## 2015-11-05 DIAGNOSIS — J45909 Unspecified asthma, uncomplicated: Secondary | ICD-10-CM | POA: Diagnosis not present

## 2015-11-05 DIAGNOSIS — E559 Vitamin D deficiency, unspecified: Secondary | ICD-10-CM | POA: Diagnosis not present

## 2015-11-19 DIAGNOSIS — D860 Sarcoidosis of lung: Secondary | ICD-10-CM | POA: Diagnosis not present

## 2015-11-19 DIAGNOSIS — J45909 Unspecified asthma, uncomplicated: Secondary | ICD-10-CM | POA: Diagnosis not present

## 2015-11-19 DIAGNOSIS — I10 Essential (primary) hypertension: Secondary | ICD-10-CM | POA: Diagnosis not present

## 2015-11-19 DIAGNOSIS — E538 Deficiency of other specified B group vitamins: Secondary | ICD-10-CM | POA: Diagnosis not present

## 2015-11-19 DIAGNOSIS — J302 Other seasonal allergic rhinitis: Secondary | ICD-10-CM | POA: Diagnosis not present

## 2015-11-19 DIAGNOSIS — E559 Vitamin D deficiency, unspecified: Secondary | ICD-10-CM | POA: Diagnosis not present

## 2015-11-19 DIAGNOSIS — K30 Functional dyspepsia: Secondary | ICD-10-CM | POA: Diagnosis not present

## 2015-11-19 DIAGNOSIS — Z124 Encounter for screening for malignant neoplasm of cervix: Secondary | ICD-10-CM | POA: Diagnosis not present

## 2015-11-19 DIAGNOSIS — Z113 Encounter for screening for infections with a predominantly sexual mode of transmission: Secondary | ICD-10-CM | POA: Diagnosis not present

## 2015-11-21 DIAGNOSIS — D860 Sarcoidosis of lung: Secondary | ICD-10-CM | POA: Diagnosis not present

## 2015-11-21 DIAGNOSIS — E041 Nontoxic single thyroid nodule: Secondary | ICD-10-CM | POA: Diagnosis not present

## 2015-11-21 DIAGNOSIS — Z01818 Encounter for other preprocedural examination: Secondary | ICD-10-CM | POA: Diagnosis not present

## 2015-11-24 DIAGNOSIS — Z886 Allergy status to analgesic agent status: Secondary | ICD-10-CM | POA: Diagnosis not present

## 2015-11-24 DIAGNOSIS — R05 Cough: Secondary | ICD-10-CM | POA: Diagnosis not present

## 2015-11-24 DIAGNOSIS — Z91010 Allergy to peanuts: Secondary | ICD-10-CM | POA: Diagnosis not present

## 2015-11-24 DIAGNOSIS — Z883 Allergy status to other anti-infective agents status: Secondary | ICD-10-CM | POA: Diagnosis not present

## 2015-11-24 DIAGNOSIS — Z79899 Other long term (current) drug therapy: Secondary | ICD-10-CM | POA: Diagnosis not present

## 2015-11-24 DIAGNOSIS — R091 Pleurisy: Secondary | ICD-10-CM | POA: Diagnosis not present

## 2015-11-24 DIAGNOSIS — Z888 Allergy status to other drugs, medicaments and biological substances status: Secondary | ICD-10-CM | POA: Diagnosis not present

## 2015-11-24 DIAGNOSIS — Z8709 Personal history of other diseases of the respiratory system: Secondary | ICD-10-CM | POA: Diagnosis not present

## 2015-11-24 DIAGNOSIS — R0789 Other chest pain: Secondary | ICD-10-CM | POA: Diagnosis not present

## 2015-11-24 DIAGNOSIS — J029 Acute pharyngitis, unspecified: Secondary | ICD-10-CM | POA: Diagnosis not present

## 2015-11-24 DIAGNOSIS — R079 Chest pain, unspecified: Secondary | ICD-10-CM | POA: Diagnosis not present

## 2015-12-07 DIAGNOSIS — E538 Deficiency of other specified B group vitamins: Secondary | ICD-10-CM | POA: Diagnosis not present

## 2015-12-19 DIAGNOSIS — E538 Deficiency of other specified B group vitamins: Secondary | ICD-10-CM | POA: Diagnosis not present

## 2016-01-08 DIAGNOSIS — R222 Localized swelling, mass and lump, trunk: Secondary | ICD-10-CM | POA: Diagnosis not present

## 2016-01-08 DIAGNOSIS — N6452 Nipple discharge: Secondary | ICD-10-CM | POA: Diagnosis not present

## 2016-01-08 DIAGNOSIS — Z803 Family history of malignant neoplasm of breast: Secondary | ICD-10-CM | POA: Diagnosis not present

## 2016-01-23 DIAGNOSIS — E538 Deficiency of other specified B group vitamins: Secondary | ICD-10-CM | POA: Diagnosis not present

## 2016-01-29 DIAGNOSIS — R222 Localized swelling, mass and lump, trunk: Secondary | ICD-10-CM | POA: Diagnosis not present

## 2016-02-28 DIAGNOSIS — R634 Abnormal weight loss: Secondary | ICD-10-CM | POA: Diagnosis not present

## 2016-02-28 DIAGNOSIS — Z91018 Allergy to other foods: Secondary | ICD-10-CM | POA: Diagnosis not present

## 2016-02-28 DIAGNOSIS — Z79899 Other long term (current) drug therapy: Secondary | ICD-10-CM | POA: Diagnosis not present

## 2016-02-28 DIAGNOSIS — E041 Nontoxic single thyroid nodule: Secondary | ICD-10-CM | POA: Diagnosis not present

## 2016-02-28 DIAGNOSIS — Z888 Allergy status to other drugs, medicaments and biological substances status: Secondary | ICD-10-CM | POA: Diagnosis not present

## 2016-02-28 DIAGNOSIS — D860 Sarcoidosis of lung: Secondary | ICD-10-CM | POA: Diagnosis not present

## 2016-02-28 DIAGNOSIS — Z8249 Family history of ischemic heart disease and other diseases of the circulatory system: Secondary | ICD-10-CM | POA: Diagnosis not present

## 2016-02-28 DIAGNOSIS — Z7951 Long term (current) use of inhaled steroids: Secondary | ICD-10-CM | POA: Diagnosis not present

## 2016-02-28 DIAGNOSIS — E0590 Thyrotoxicosis, unspecified without thyrotoxic crisis or storm: Secondary | ICD-10-CM | POA: Diagnosis not present

## 2016-02-28 DIAGNOSIS — Z833 Family history of diabetes mellitus: Secondary | ICD-10-CM | POA: Diagnosis not present

## 2016-02-28 DIAGNOSIS — F458 Other somatoform disorders: Secondary | ICD-10-CM | POA: Diagnosis not present

## 2016-02-28 DIAGNOSIS — Z9889 Other specified postprocedural states: Secondary | ICD-10-CM | POA: Diagnosis not present

## 2016-02-28 DIAGNOSIS — E039 Hypothyroidism, unspecified: Secondary | ICD-10-CM | POA: Diagnosis not present

## 2016-02-28 DIAGNOSIS — D869 Sarcoidosis, unspecified: Secondary | ICD-10-CM | POA: Diagnosis not present

## 2016-02-28 DIAGNOSIS — R1310 Dysphagia, unspecified: Secondary | ICD-10-CM | POA: Diagnosis not present

## 2016-02-28 DIAGNOSIS — Z881 Allergy status to other antibiotic agents status: Secondary | ICD-10-CM | POA: Diagnosis not present

## 2016-03-06 DIAGNOSIS — K30 Functional dyspepsia: Secondary | ICD-10-CM | POA: Diagnosis not present

## 2016-03-06 DIAGNOSIS — E538 Deficiency of other specified B group vitamins: Secondary | ICD-10-CM | POA: Diagnosis not present

## 2016-03-06 DIAGNOSIS — E559 Vitamin D deficiency, unspecified: Secondary | ICD-10-CM | POA: Diagnosis not present

## 2016-03-06 DIAGNOSIS — I10 Essential (primary) hypertension: Secondary | ICD-10-CM | POA: Diagnosis not present

## 2016-03-06 DIAGNOSIS — D860 Sarcoidosis of lung: Secondary | ICD-10-CM | POA: Diagnosis not present

## 2016-03-06 DIAGNOSIS — J302 Other seasonal allergic rhinitis: Secondary | ICD-10-CM | POA: Diagnosis not present

## 2016-03-06 DIAGNOSIS — J45909 Unspecified asthma, uncomplicated: Secondary | ICD-10-CM | POA: Diagnosis not present

## 2016-03-06 DIAGNOSIS — E0590 Thyrotoxicosis, unspecified without thyrotoxic crisis or storm: Secondary | ICD-10-CM | POA: Diagnosis not present

## 2016-03-13 DIAGNOSIS — Z886 Allergy status to analgesic agent status: Secondary | ICD-10-CM | POA: Diagnosis not present

## 2016-03-13 DIAGNOSIS — E042 Nontoxic multinodular goiter: Secondary | ICD-10-CM | POA: Diagnosis not present

## 2016-03-13 DIAGNOSIS — Z888 Allergy status to other drugs, medicaments and biological substances status: Secondary | ICD-10-CM | POA: Diagnosis not present

## 2016-03-13 DIAGNOSIS — Z91010 Allergy to peanuts: Secondary | ICD-10-CM | POA: Diagnosis not present

## 2016-03-13 DIAGNOSIS — Z79899 Other long term (current) drug therapy: Secondary | ICD-10-CM | POA: Diagnosis not present

## 2016-03-13 DIAGNOSIS — D860 Sarcoidosis of lung: Secondary | ICD-10-CM | POA: Diagnosis not present

## 2016-03-13 DIAGNOSIS — D649 Anemia, unspecified: Secondary | ICD-10-CM | POA: Diagnosis not present

## 2016-03-13 DIAGNOSIS — Z9102 Food additives allergy status: Secondary | ICD-10-CM | POA: Diagnosis not present

## 2016-03-19 DIAGNOSIS — Z886 Allergy status to analgesic agent status: Secondary | ICD-10-CM | POA: Diagnosis not present

## 2016-03-19 DIAGNOSIS — E042 Nontoxic multinodular goiter: Secondary | ICD-10-CM | POA: Diagnosis not present

## 2016-03-19 DIAGNOSIS — D860 Sarcoidosis of lung: Secondary | ICD-10-CM | POA: Diagnosis not present

## 2016-03-19 DIAGNOSIS — Z9102 Food additives allergy status: Secondary | ICD-10-CM | POA: Diagnosis not present

## 2016-03-19 DIAGNOSIS — Z888 Allergy status to other drugs, medicaments and biological substances status: Secondary | ICD-10-CM | POA: Diagnosis not present

## 2016-03-19 DIAGNOSIS — D649 Anemia, unspecified: Secondary | ICD-10-CM | POA: Diagnosis not present

## 2016-03-20 DIAGNOSIS — Z9102 Food additives allergy status: Secondary | ICD-10-CM | POA: Diagnosis not present

## 2016-03-20 DIAGNOSIS — D649 Anemia, unspecified: Secondary | ICD-10-CM | POA: Diagnosis not present

## 2016-03-20 DIAGNOSIS — E042 Nontoxic multinodular goiter: Secondary | ICD-10-CM | POA: Diagnosis not present

## 2016-03-20 DIAGNOSIS — Z888 Allergy status to other drugs, medicaments and biological substances status: Secondary | ICD-10-CM | POA: Diagnosis not present

## 2016-03-20 DIAGNOSIS — Z886 Allergy status to analgesic agent status: Secondary | ICD-10-CM | POA: Diagnosis not present

## 2016-03-20 DIAGNOSIS — D860 Sarcoidosis of lung: Secondary | ICD-10-CM | POA: Diagnosis not present

## 2016-04-03 DIAGNOSIS — Z4889 Encounter for other specified surgical aftercare: Secondary | ICD-10-CM | POA: Diagnosis not present

## 2016-04-03 DIAGNOSIS — R52 Pain, unspecified: Secondary | ICD-10-CM | POA: Diagnosis not present

## 2016-04-03 DIAGNOSIS — E041 Nontoxic single thyroid nodule: Secondary | ICD-10-CM | POA: Diagnosis not present

## 2016-04-03 DIAGNOSIS — E890 Postprocedural hypothyroidism: Secondary | ICD-10-CM | POA: Diagnosis not present

## 2016-04-03 DIAGNOSIS — L905 Scar conditions and fibrosis of skin: Secondary | ICD-10-CM | POA: Diagnosis not present

## 2016-04-14 IMAGING — CR DG CHEST 2V
2 series · 2 of 2 positions shown · non-contrast
Comparison: February 24, 2012

CLINICAL DATA: Chest pain starting 11 this morning

EXAM:
CHEST  2 VIEW

[w chest pa]
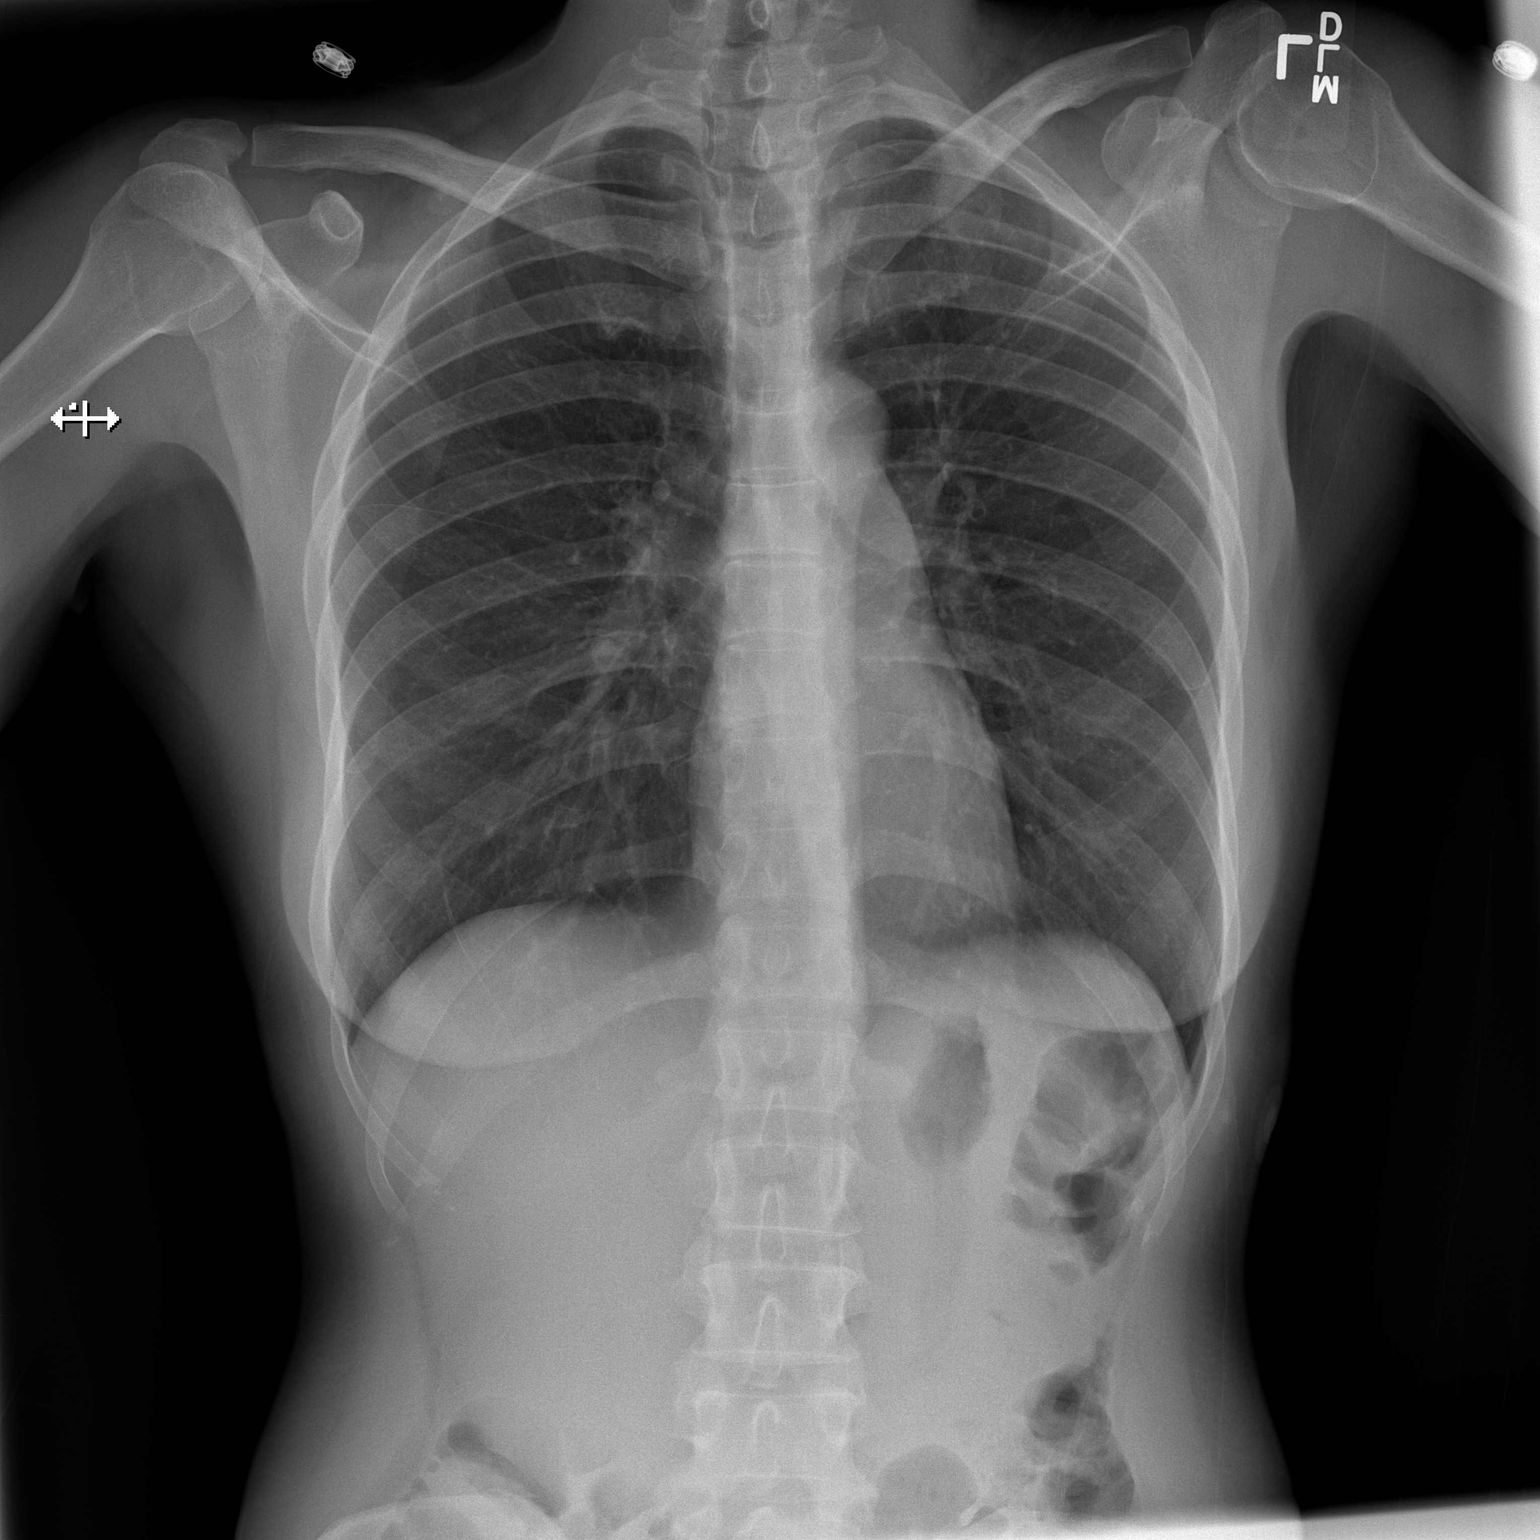

[w chest lat]
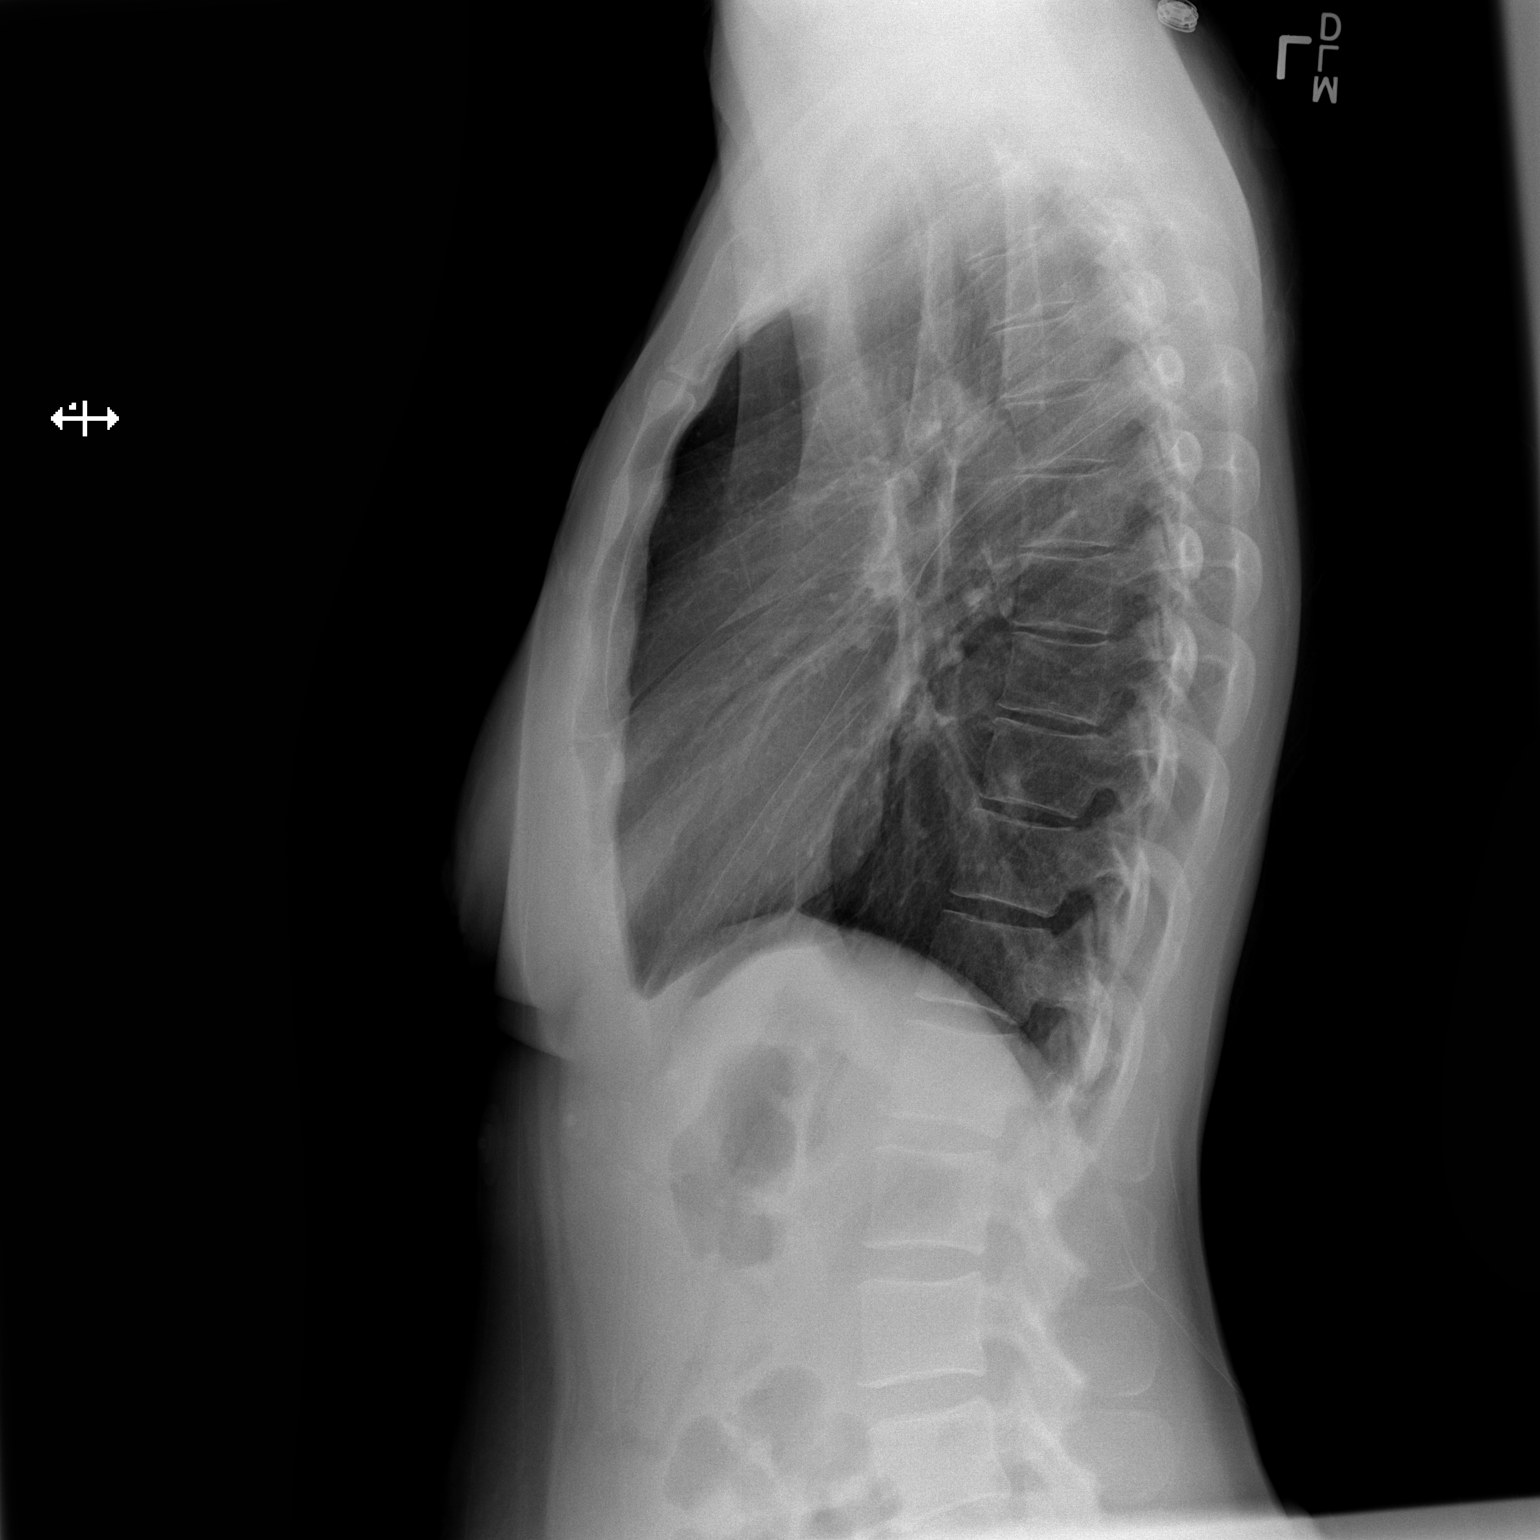

[2 of 2 positions shown; findings below may reference images not displayed]

FINDINGS: The heart size and mediastinal contours are within normal limits.
There is no focal infiltrate, pulmonary edema, or pleural effusion.
The visualized skeletal structures are unremarkable.
IMPRESSION: No active cardiopulmonary disease.

## 2016-06-10 IMAGING — CR DG NECK SOFT TISSUE
2 series · 2 of 2 positions shown · non-contrast
Comparison: None.

CLINICAL DATA: Possible ingested chicken bone

EXAM:
NECK SOFT TISSUES - 1+ VIEW

[w soft tissue neck lat]
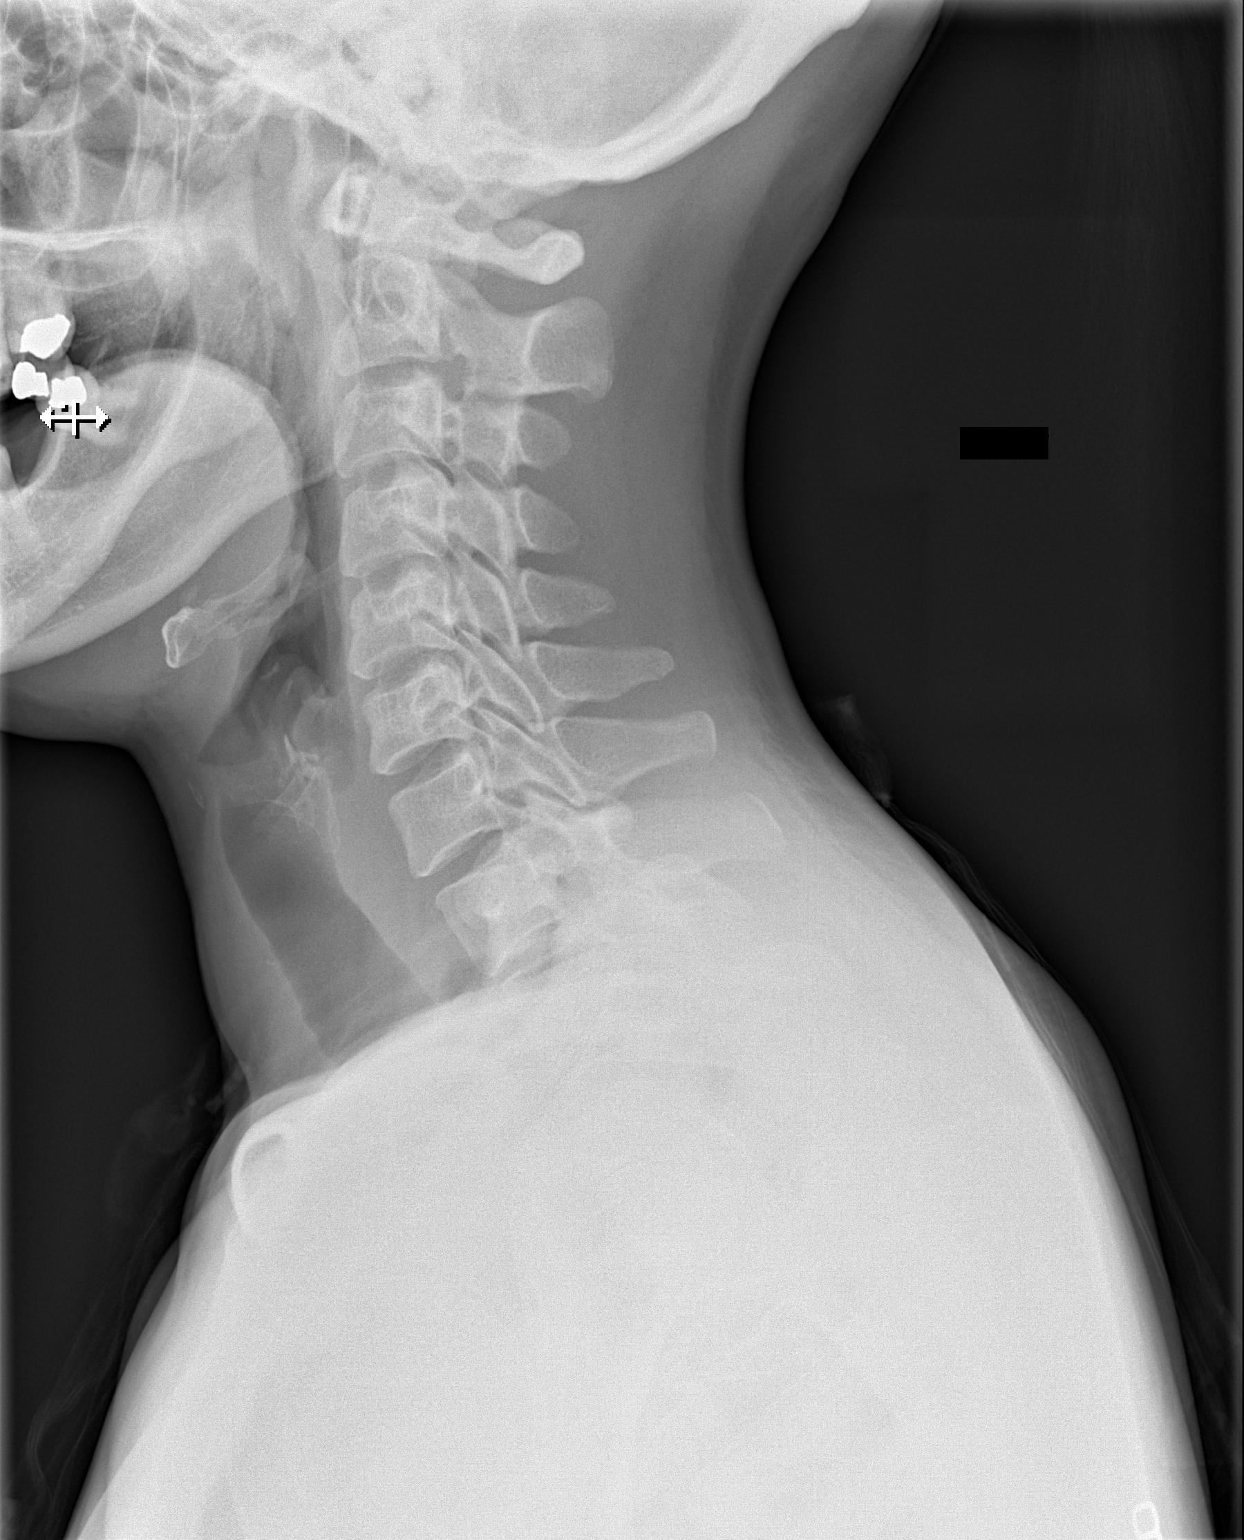

[w soft tissue neck ap]
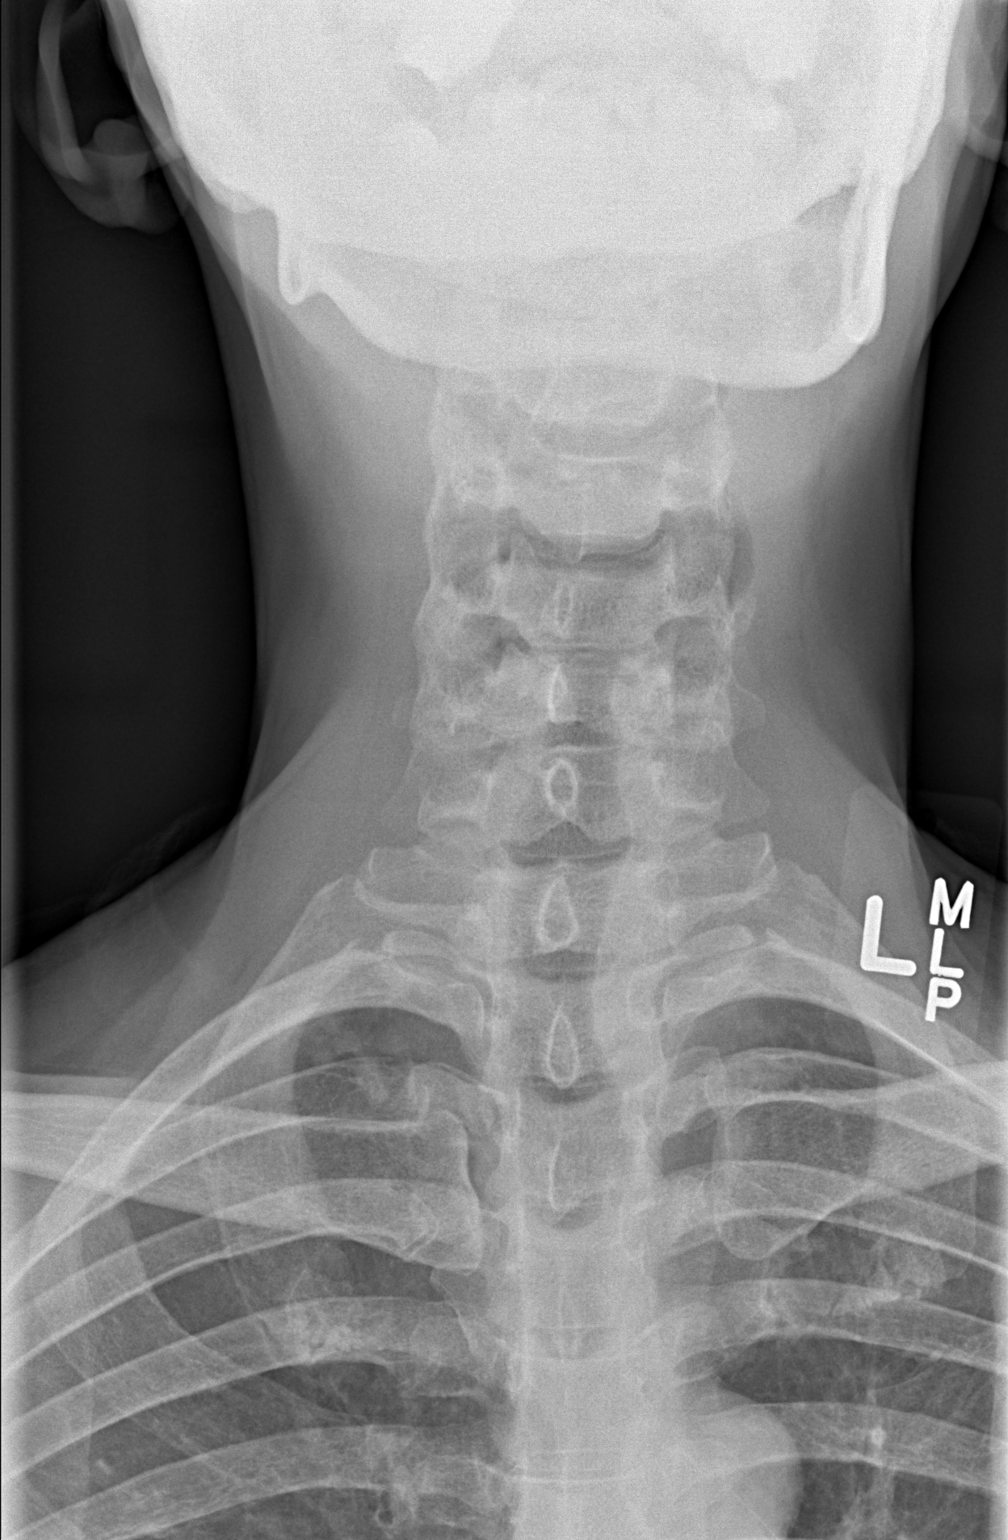

[2 of 2 positions shown; findings below may reference images not displayed]

FINDINGS: There is no evidence of retropharyngeal soft tissue swelling or
epiglottic enlargement. The cervical airway is unremarkable and no
radio-opaque foreign body identified.
IMPRESSION: No acute abnormality seen.

## 2016-07-01 DIAGNOSIS — D860 Sarcoidosis of lung: Secondary | ICD-10-CM | POA: Diagnosis not present

## 2016-07-01 DIAGNOSIS — E0590 Thyrotoxicosis, unspecified without thyrotoxic crisis or storm: Secondary | ICD-10-CM | POA: Diagnosis not present

## 2016-07-01 DIAGNOSIS — K30 Functional dyspepsia: Secondary | ICD-10-CM | POA: Diagnosis not present

## 2016-07-01 DIAGNOSIS — H539 Unspecified visual disturbance: Secondary | ICD-10-CM | POA: Diagnosis not present

## 2016-07-01 DIAGNOSIS — D509 Iron deficiency anemia, unspecified: Secondary | ICD-10-CM | POA: Diagnosis not present

## 2016-07-01 DIAGNOSIS — J45909 Unspecified asthma, uncomplicated: Secondary | ICD-10-CM | POA: Diagnosis not present

## 2016-07-01 DIAGNOSIS — Z0000 Encounter for general adult medical examination without abnormal findings: Secondary | ICD-10-CM | POA: Diagnosis not present

## 2016-07-01 DIAGNOSIS — E538 Deficiency of other specified B group vitamins: Secondary | ICD-10-CM | POA: Diagnosis not present

## 2016-07-01 DIAGNOSIS — J302 Other seasonal allergic rhinitis: Secondary | ICD-10-CM | POA: Diagnosis not present

## 2016-07-01 DIAGNOSIS — E559 Vitamin D deficiency, unspecified: Secondary | ICD-10-CM | POA: Diagnosis not present

## 2016-07-01 DIAGNOSIS — I10 Essential (primary) hypertension: Secondary | ICD-10-CM | POA: Diagnosis not present

## 2016-07-20 DIAGNOSIS — R0602 Shortness of breath: Secondary | ICD-10-CM | POA: Diagnosis not present

## 2016-07-20 DIAGNOSIS — D259 Leiomyoma of uterus, unspecified: Secondary | ICD-10-CM | POA: Diagnosis not present

## 2016-07-20 DIAGNOSIS — D860 Sarcoidosis of lung: Secondary | ICD-10-CM | POA: Diagnosis not present

## 2016-07-20 DIAGNOSIS — R918 Other nonspecific abnormal finding of lung field: Secondary | ICD-10-CM | POA: Diagnosis not present

## 2016-07-20 DIAGNOSIS — Z79899 Other long term (current) drug therapy: Secondary | ICD-10-CM | POA: Diagnosis not present

## 2016-07-20 DIAGNOSIS — R079 Chest pain, unspecified: Secondary | ICD-10-CM | POA: Diagnosis not present

## 2016-07-20 DIAGNOSIS — R110 Nausea: Secondary | ICD-10-CM | POA: Diagnosis not present

## 2016-07-20 DIAGNOSIS — R1013 Epigastric pain: Secondary | ICD-10-CM | POA: Diagnosis not present

## 2016-07-20 DIAGNOSIS — R1011 Right upper quadrant pain: Secondary | ICD-10-CM | POA: Diagnosis not present

## 2016-07-20 DIAGNOSIS — R935 Abnormal findings on diagnostic imaging of other abdominal regions, including retroperitoneum: Secondary | ICD-10-CM | POA: Diagnosis not present

## 2016-07-20 DIAGNOSIS — M549 Dorsalgia, unspecified: Secondary | ICD-10-CM | POA: Diagnosis not present

## 2016-07-20 DIAGNOSIS — N281 Cyst of kidney, acquired: Secondary | ICD-10-CM | POA: Diagnosis not present

## 2016-07-20 DIAGNOSIS — R0789 Other chest pain: Secondary | ICD-10-CM | POA: Diagnosis not present

## 2016-07-20 DIAGNOSIS — R109 Unspecified abdominal pain: Secondary | ICD-10-CM | POA: Diagnosis not present

## 2016-08-13 DIAGNOSIS — B359 Dermatophytosis, unspecified: Secondary | ICD-10-CM | POA: Diagnosis not present

## 2016-08-17 IMAGING — US US SOFT TISSUE HEAD/NECK
1 series · 14 of 17 positions shown · non-contrast
Comparison: Neck CT 08/12/2014.

CLINICAL DATA: Thyroid nodule.

EXAM:
THYROID ULTRASOUND
TECHNIQUE: Ultrasound examination of the thyroid gland and adjacent soft
tissues was performed.

[Series 1: us soft tissue head/neck · 0.07mm/px · 17 acquisitions, 14 frames shown]
[im 1/17]
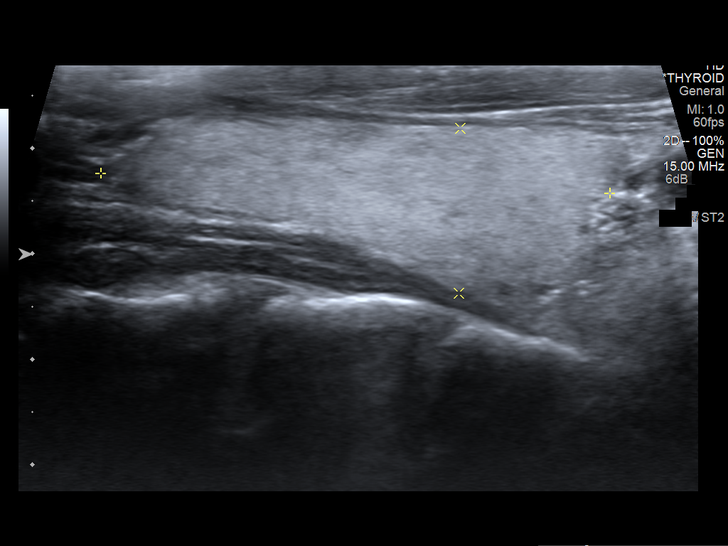
[im 2/17]
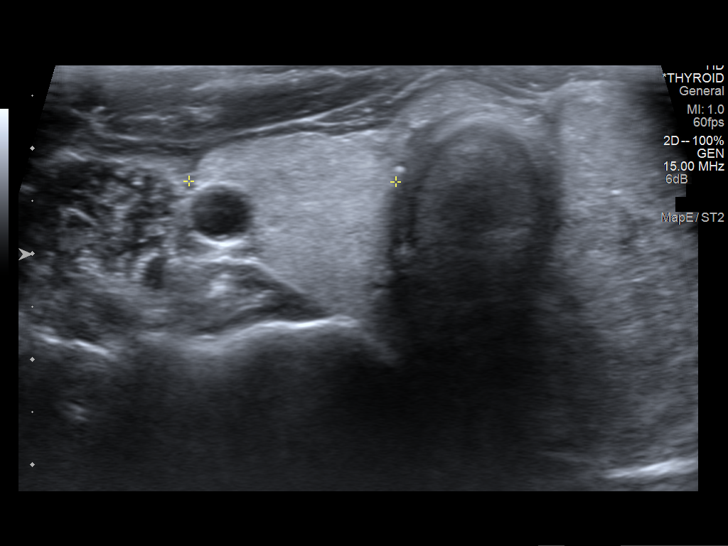
[im 4/17]
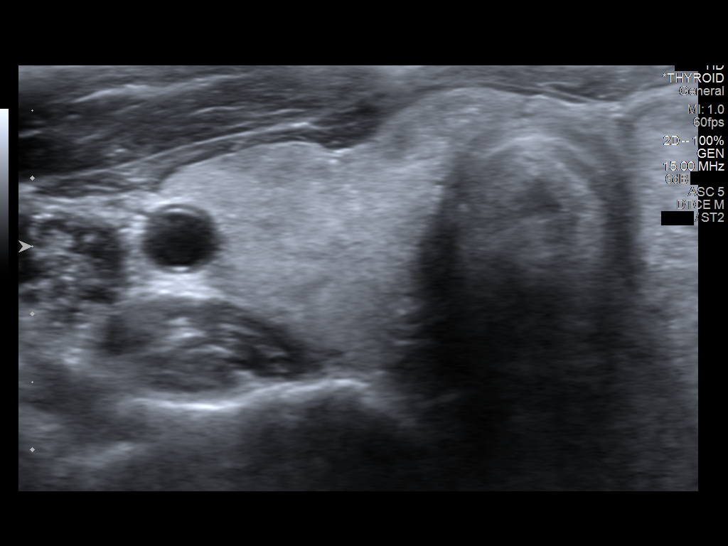
[im 5/17]
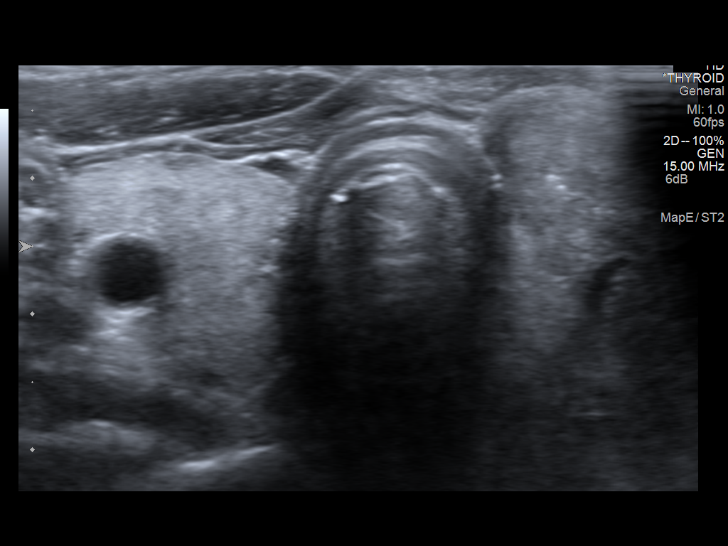
[im 6/17]
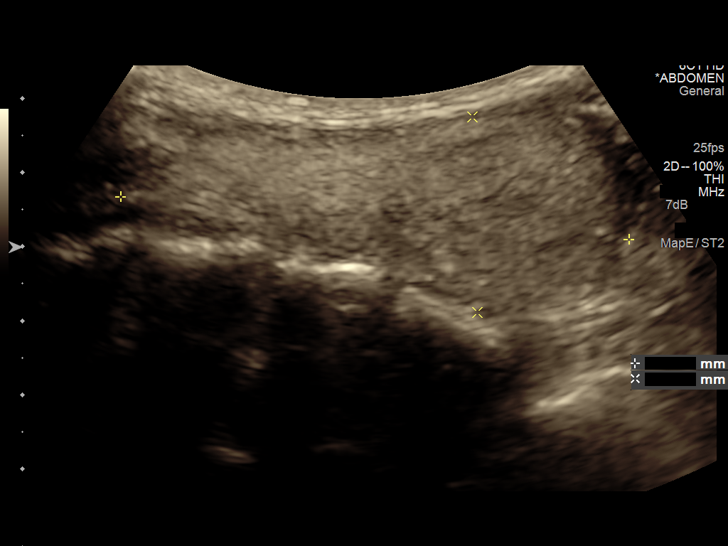
[im 7/17]
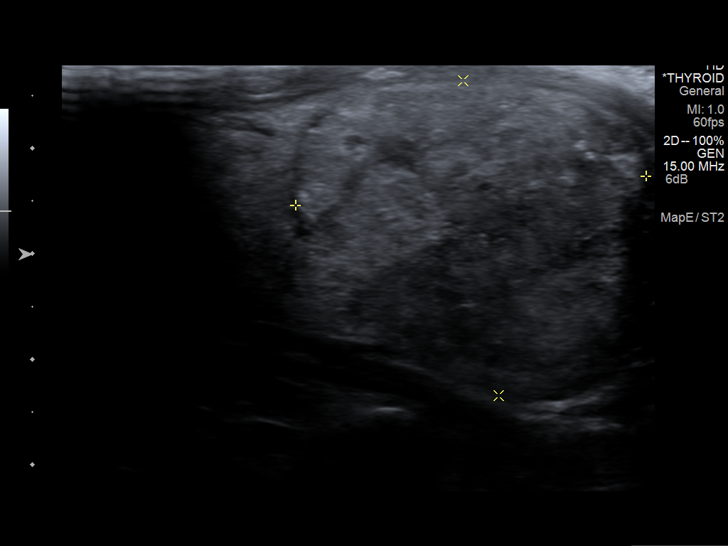
[im 8/17]
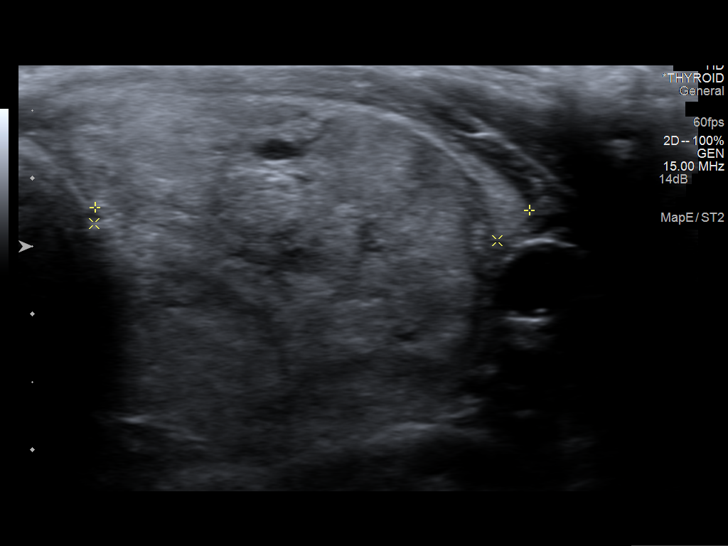
[im 10/17]
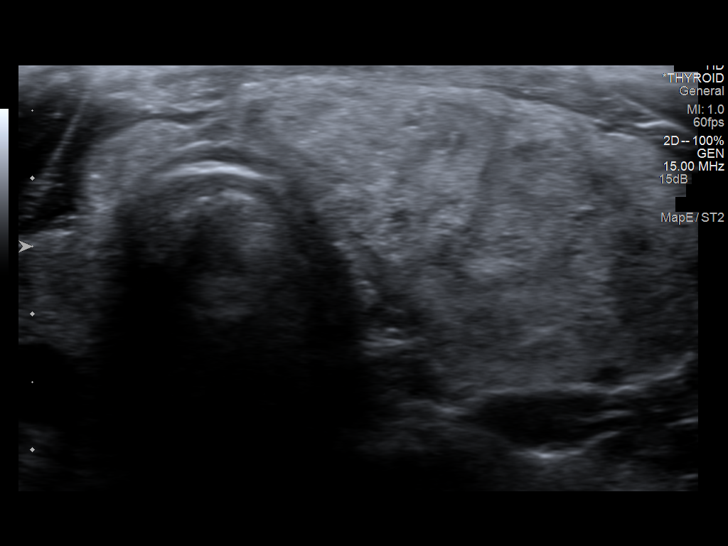
[im 11/17]
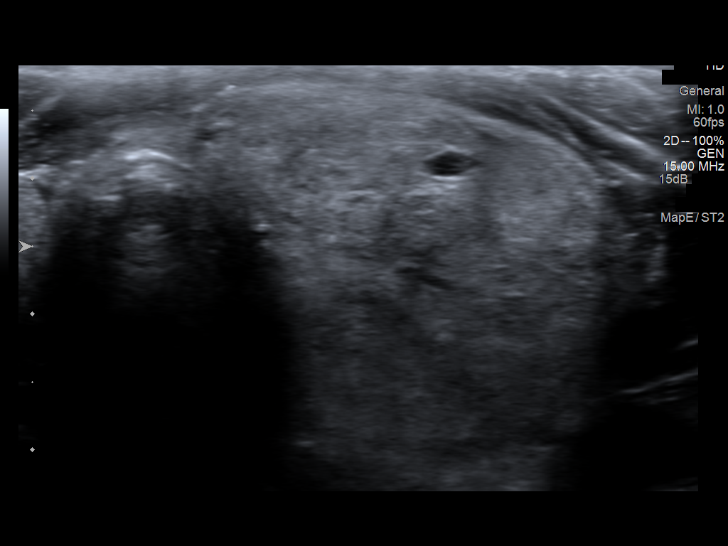
[im 12/17]
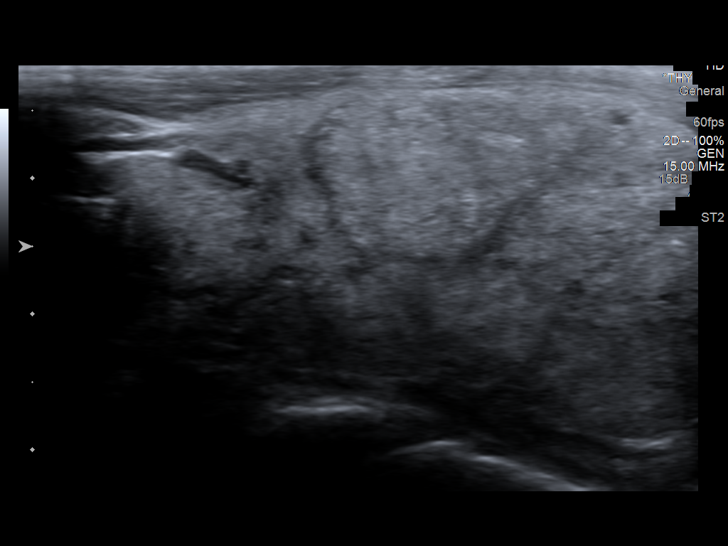
[im 13/17]
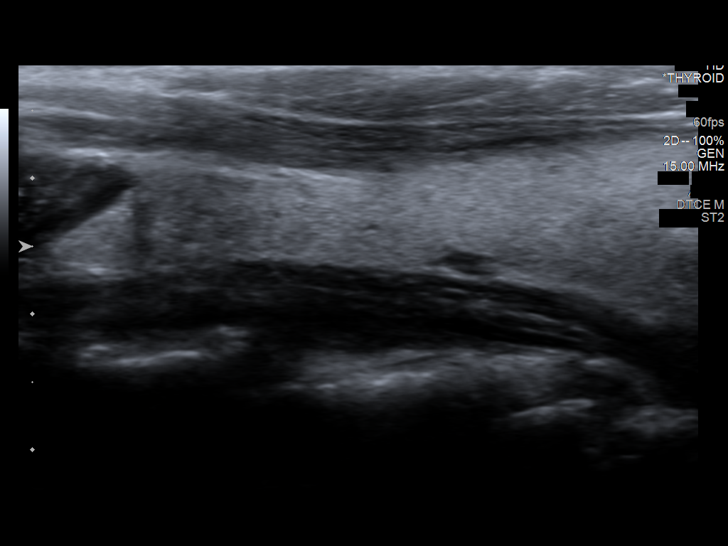
[im 14/17]
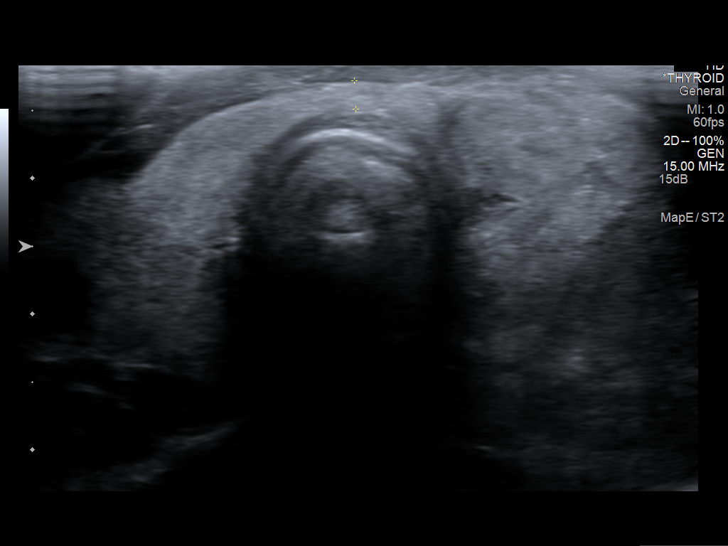
[im 16/17]
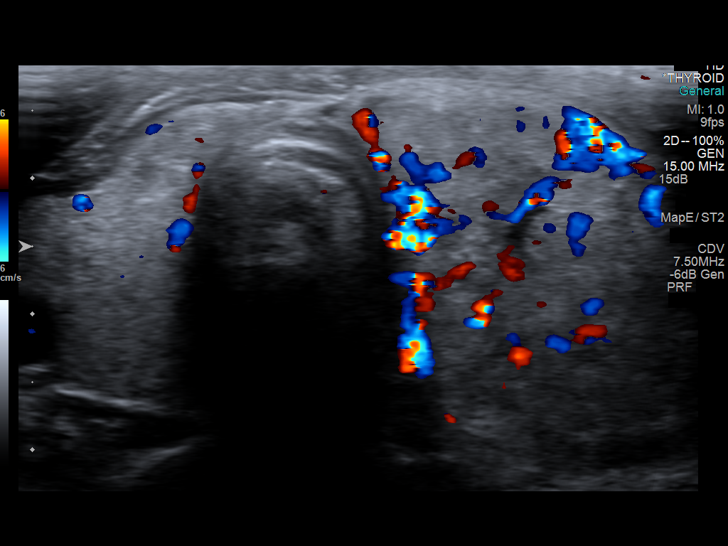
[im 17/17]
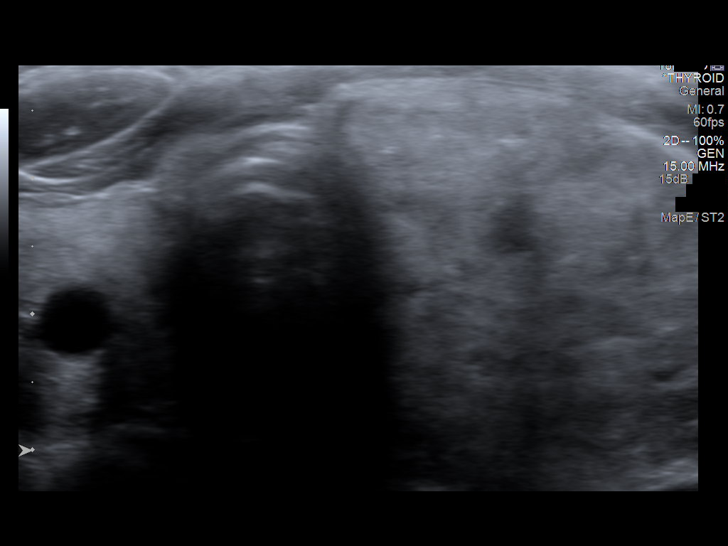

[14 of 17 positions shown; findings below may reference images not displayed]

FINDINGS: Right thyroid lobe

Measurements: 4.8 x 1.6 x 2.0 cm.  No nodules visualized.

Left thyroid lobe

Measurements: 6.8 x 2.6 x 3.2 cm. Large heterogeneous 3.3 x 3.0 x
3.0 cm solid mass

Isthmus

Thickness: 2.1 mm.  No nodules visualized.

Lymphadenopathy

None visualized.
IMPRESSION: Large heterogeneous 3.3 x 3.0 x 3.0 cm left thyroid lobe solid mass.
This corresponds to recent CT findings. Findings meet consensus
criteria for biopsy. Ultrasound-guided fine needle aspiration should
be considered, as per the consensus statement: Management of Thyroid
Nodules Detected at US: Society of Radiologists in Ultrasound

## 2016-09-03 ENCOUNTER — Encounter: Payer: Self-pay | Admitting: Oncology

## 2016-09-03 ENCOUNTER — Telehealth: Payer: Self-pay | Admitting: Oncology

## 2016-09-03 NOTE — Telephone Encounter (Signed)
Pt returned my call on  4/10 to schedule a hem appt. Appt has been scheduled for the pt to see Dr. Thana Farr on 4/16 at 1pm. Pt aware to arrive 30 minutes early. Voiced understanding. Letter mailed.

## 2016-09-04 DIAGNOSIS — Z9889 Other specified postprocedural states: Secondary | ICD-10-CM | POA: Diagnosis not present

## 2016-09-04 DIAGNOSIS — E559 Vitamin D deficiency, unspecified: Secondary | ICD-10-CM | POA: Diagnosis not present

## 2016-09-04 DIAGNOSIS — E042 Nontoxic multinodular goiter: Secondary | ICD-10-CM | POA: Diagnosis not present

## 2016-09-04 DIAGNOSIS — Z8639 Personal history of other endocrine, nutritional and metabolic disease: Secondary | ICD-10-CM | POA: Diagnosis not present

## 2016-09-05 ENCOUNTER — Encounter: Payer: Self-pay | Admitting: Internal Medicine

## 2016-11-13 ENCOUNTER — Encounter (HOSPITAL_COMMUNITY): Payer: Self-pay | Admitting: *Deleted

## 2016-11-13 ENCOUNTER — Emergency Department (HOSPITAL_COMMUNITY)
Admission: EM | Admit: 2016-11-13 | Discharge: 2016-11-13 | Disposition: A | Discharge status: Home / Self Care | Payer: Medicare Other | Attending: Emergency Medicine | Admitting: Emergency Medicine

## 2016-11-13 ENCOUNTER — Emergency Department (HOSPITAL_COMMUNITY): Payer: Medicare Other

## 2016-11-13 DIAGNOSIS — J45909 Unspecified asthma, uncomplicated: Secondary | ICD-10-CM | POA: Diagnosis not present

## 2016-11-13 DIAGNOSIS — I10 Essential (primary) hypertension: Secondary | ICD-10-CM | POA: Insufficient documentation

## 2016-11-13 DIAGNOSIS — R079 Chest pain, unspecified: Secondary | ICD-10-CM | POA: Diagnosis not present

## 2016-11-13 DIAGNOSIS — Z7951 Long term (current) use of inhaled steroids: Secondary | ICD-10-CM | POA: Insufficient documentation

## 2016-11-13 DIAGNOSIS — Z862 Personal history of diseases of the blood and blood-forming organs and certain disorders involving the immune mechanism: Secondary | ICD-10-CM

## 2016-11-13 DIAGNOSIS — Z91010 Allergy to peanuts: Secondary | ICD-10-CM | POA: Diagnosis not present

## 2016-11-13 DIAGNOSIS — D869 Sarcoidosis, unspecified: Secondary | ICD-10-CM | POA: Insufficient documentation

## 2016-11-13 DIAGNOSIS — R0789 Other chest pain: Secondary | ICD-10-CM | POA: Diagnosis not present

## 2016-11-13 LAB — BASIC METABOLIC PANEL
Anion gap: 8 (ref 5–15)
BUN: 9 mg/dL (ref 6–20)
CO2: 27 mmol/L (ref 22–32)
Calcium: 9.2 mg/dL (ref 8.9–10.3)
Chloride: 103 mmol/L (ref 101–111)
Creatinine, Ser: 0.76 mg/dL (ref 0.44–1.00)
GFR calc Af Amer: >60 mL/min (ref >60)
GFR calc non Af Amer: >60 mL/min (ref >60)
Glucose, Bld: 94 mg/dL (ref 65–99)
Potassium: 3.4 mmol/L — ABNORMAL LOW (ref 3.5–5.1)
Sodium: 138 mmol/L (ref 135–145)

## 2016-11-13 LAB — CBC
HCT: 36.2 % (ref 36.0–46.0)
Hemoglobin: 12.7 g/dL (ref 12.0–15.0)
MCH: 31.1 pg (ref 26.0–34.0)
MCHC: 35.1 g/dL (ref 30.0–36.0)
MCV: 88.5 fL (ref 78.0–100.0)
Platelets: 415 10*3/uL — ABNORMAL HIGH (ref 150–400)
RBC: 4.09 MIL/uL (ref 3.87–5.11)
RDW: 12.1 % (ref 11.5–15.5)
WBC: 7.4 10*3/uL (ref 4.0–10.5)

## 2016-11-13 LAB — D-DIMER, QUANTITATIVE (NOT AT ARMC): D-Dimer, Quant: <0.27 ug/mL-FEU (ref 0.00–0.50)

## 2016-11-13 LAB — POCT I-STAT TROPONIN I: Troponin i, poc: 0 ng/mL (ref 0.00–0.08)

## 2016-11-13 MED ORDER — IPRATROPIUM BROMIDE 0.02 % IN SOLN
0.5000 mg | Freq: Once | RESPIRATORY_TRACT | Status: AC
  Administered 2016-11-13: 0.5 mg via RESPIRATORY_TRACT
  Filled 2016-11-13: qty 2.5

## 2016-11-13 MED ORDER — PREDNISOLONE 15 MG/5ML PO SOLN: 40.0000 mg | Freq: Every day | ORAL | 0 refills | Status: AC

## 2016-11-13 MED ORDER — ACETAMINOPHEN 160 MG/5ML PO SOLN
1000.0000 mg | Freq: Once | ORAL | Status: AC
  Administered 2016-11-13: 1000 mg via ORAL
  Filled 2016-11-13: qty 40.6

## 2016-11-13 MED ORDER — ALBUTEROL SULFATE (2.5 MG/3ML) 0.083% IN NEBU
5.0000 mg | INHALATION_SOLUTION | Freq: Once | RESPIRATORY_TRACT | Status: AC
  Administered 2016-11-13: 5 mg via RESPIRATORY_TRACT
  Filled 2016-11-13: qty 6

## 2016-11-13 MED ORDER — PREDNISOLONE SODIUM PHOSPHATE 15 MG/5ML PO SOLN
60.0000 mg | Freq: Once | ORAL | Status: DC
Start: 2016-11-13 — End: 2016-11-13
  Filled 2016-11-13: qty 4

## 2016-11-13 MED ORDER — PREDNISOLONE SODIUM PHOSPHATE 15 MG/5ML PO SOLN
40.0000 mg | Freq: Once | ORAL | Status: AC
  Administered 2016-11-13: 40 mg via ORAL
  Filled 2016-11-13: qty 3

## 2016-11-13 NOTE — ED Notes (Signed)
Patient refused full dose of orapred, provider notified.

## 2016-11-13 NOTE — ED Triage Notes (Signed)
Patient is alert and oriented x4.  She is complaining of chest tightness that started 3 days ago and is getting worse on the left Back.  Currently she rates her pain 9 of 10.

## 2016-11-13 NOTE — Discharge Instructions (Signed)
Please follow up with your doctor for recheck this week.

## 2016-11-13 NOTE — ED Provider Notes (Signed)
[place] DEPT Provider Note   CSN: [id number] Arrival date & time: 11/13/16  0041     History   Chief Complaint Chief Complaint  Patient presents with  . Chest Pain    HPI Rebecca Sanchez is a 46 y.o. female.  Patient with history of sarcoidosis, HTN, GERD, thyroid disease presents with complaint of chest tightness. It started on the right side 3 days ago and now includes the left chest through to the left upper back. No cough or fever. Lying down, certain movements and activity make it worse. She has an inhaler at home which she used without relief. No vomiting or abdominal pain, but she reports increased belching and flatulence. Mylanta used without change.    The history is provided by the patient. No language interpreter was used.  Chest Pain   Pertinent negatives include no abdominal pain, no diaphoresis, no fever, no nausea, no shortness of breath, no vomiting and no weakness.    Past Medical History:  Diagnosis Date  . Anemia   . Asthma   . Chest pain    a. 04/2012 Echo: EF 65%, nl wall motion.  . Chronic bronchitis   . GERD (gastroesophageal reflux disease)   . Heart murmur   . HTN (hypertension)   . Sarcoid   . Thyromegaly     Patient Active Problem List   Diagnosis Date Noted  . Vaginitis and vulvovaginitis, unspecified 04/18/2013  . Internal hemorrhoids with prolapse & pain 12/23/2012  . Chest pain 06/17/2012  . Sarcoid 06/17/2012  . Chronic bronchitis     Past Surgical History:  Procedure Laterality Date  . BUNIONECTOMY    . HERNIA REPAIR      OB History    Gravida Para Term Preterm AB Living   4 3 3   1      SAB TAB Ectopic Multiple Live Births   1               Home Medications    Prior to Admission medications   Medication Sig Start Date End Date Taking? Authorizing Provider  albuterol (PROAIR HFA) 108 (90 BASE) MCG/ACT inhaler Inhale 2 puffs into the lungs every 6 (six) hours as needed for wheezing or shortness of breath.      [provider]  amoxicillin (AMOXIL) 500 MG capsule Take 4 capsules (2,000 mg total) by mouth once. Patient not taking: Reported on 07/23/2014 07/18/14   Shelly Bombard, MD  Cholecalciferol (VITAMIN D-1000 MAX ST) 1000 units tablet Take 1 tablet by mouth daily.    [provider]  diphenhydrAMINE (BENADRYL) 25 MG tablet Take 1 tablet (25 mg total) by mouth every 6 (six) hours. Patient not taking: Reported on 04/05/2015 08/07/14   Patel-Mills, Orvil Feil, PA-C  famotidine (PEPCID) 20 MG tablet Take 1 tablet (20 mg total) by mouth 2 (two) times daily. Patient not taking: Reported on 06/09/2014 05/27/14   Charlesetta Shanks, MD  fluticasone [facility]) 50 MCG/ACT nasal spray Place 2 sprays into both nostrils 2 (two) times daily as needed for allergies.     [provider]  lidocaine (XYLOCAINE) 2 % solution Use as directed 20 mLs in the mouth or throat as needed for mouth pain. 04/05/15   Margarita Mail, PA-C  Oxycodone HCl 10 MG TABS Take 1 tablet (10 mg total) by mouth every 6 (six) hours as needed. Patient not taking: Reported on 07/18/2014 06/23/14   Shelly Bombard, MD  predniSONE (STERAPRED UNI-PAK) 10 MG tablet Take by mouth taper  from 4 doses each day to 1 dose and stop. Patient not taking: Reported on 04/05/2015 08/07/14   Ottie Glazier, PA-C    Family History Family History  Problem Relation Age of Onset  . Hypertension Mother   . Hypertension Father   . Cancer Maternal Grandmother        lung  . Cancer Paternal Grandmother        lung    Social History Social History  Substance Use Topics  . Smoking status: Never Smoker  . Smokeless tobacco: Never Used  . Alcohol use No     Allergies   Atenolol; Azithromycin; Motrin [ibuprofen]; Peanut oil; Peanut-containing drug products; Propranolol; Tapazole [methimazole]; and Zyrtec [cetirizine]   Review of Systems Review of Systems  Constitutional: Negative for chills, diaphoresis and fever.  HENT: Negative.    Respiratory: Negative for shortness of breath.   Cardiovascular: Positive for chest pain.  Gastrointestinal: Negative.  Negative for abdominal pain, nausea and vomiting.  Musculoskeletal: Negative.  Negative for myalgias.  Skin: Negative.  Negative for color change and rash.  Neurological: Negative.  Negative for weakness.     Physical Exam Updated Vital Signs BP (!) 166/97 (BP Location: Left Arm)   Pulse 80   Temp 98 F (36.7 C) (Oral)   Resp 10   Ht 5\' 4"  (1.626 m)   Wt 49.9 kg (110 lb)   LMP 11/10/2016   SpO2 100%   BMI 18.88 kg/m   Physical Exam  Constitutional: She is oriented to person, place, and time. She appears well-developed and well-nourished.  HENT:  Head: Normocephalic.  Neck: Normal range of motion. Neck supple.  Cardiovascular: Normal rate and regular rhythm.   No murmur heard. Pulmonary/Chest: Effort normal and breath sounds normal. She has no wheezes. She has no rales. She exhibits no tenderness.  Abdominal: Soft. Bowel sounds are normal. There is no tenderness. There is no rebound and no guarding.  Musculoskeletal: Normal range of motion.  Neurological: She is alert and oriented to person, place, and time.  Skin: Skin is warm and dry. No rash noted.  Psychiatric: She has a normal mood and affect.     ED Treatments / Results  Labs (all labs ordered are listed, but only abnormal results are displayed) Labs Reviewed  CBC - Abnormal; Notable for the following:       Result Value   Platelets 415 (*)    All other components within normal limits  BASIC METABOLIC PANEL  I-STAT TROPOININ, ED  POCT I-STAT TROPONIN I   Results for orders placed or performed during the hospital encounter of 88/32/54  Basic metabolic panel  Result Value Ref Range   Sodium 138 135 - 145 mmol/L   Potassium 3.4 (L) 3.5 - 5.1 mmol/L   Chloride 103 101 - 111 mmol/L   CO2 27 22 - 32 mmol/L   Glucose, Bld 94 65 - 99 mg/dL   BUN 9 6 - 20 mg/dL   Creatinine, Ser 0.76 0.44 -  1.00 mg/dL   Calcium 9.2 8.9 - 10.3 mg/dL   GFR calc non Af Amer >60 >60 mL/min   GFR calc Af Amer >60 >60 mL/min   Anion gap 8 5 - 15  CBC  Result Value Ref Range   WBC 7.4 4.0 - 10.5 K/uL   RBC 4.09 3.87 - 5.11 MIL/uL   Hemoglobin 12.7 12.0 - 15.0 g/dL   HCT 36.2 36.0 - 46.0 %   MCV 88.5 78.0 - 100.0 fL  MCH 31.1 26.0 - 34.0 pg   MCHC 35.1 30.0 - 36.0 g/dL   RDW 12.1 11.5 - 15.5 %   Platelets 415 (H) 150 - 400 K/uL  D-dimer, quantitative (not at [facility])  Result Value Ref Range   D-Dimer, Quant <0.27 0.00 - 0.50 ug/mL-FEU  POCT i-Stat troponin I  Result Value Ref Range   Troponin i, poc 0.00 0.00 - 0.08 ng/mL   Comment 3            EKG  EKG Interpretation  Date/Time:  Thursday November 13 2016 00:50:12 EDT Ventricular Rate:  73 PR Interval:    QRS Duration: 128 QT Interval:  426 QTC Calculation: 470 R Axis:   80 Text Interpretation:  Sinus rhythm Short PR interval Otherwise within normal limits When compared with ECG of 08/16/2014, No significant change was found Confirmed by Delora Fuel ([id number]) on 11/13/2016 1:04:14 AM       Radiology Dg Chest 2 View  Result Date: 11/13/2016 CLINICAL DATA:  Acute onset of left upper posterior chest pain and tightness. Initial encounter. EXAM: CHEST  2 VIEW COMPARISON:  None. FINDINGS: The lungs are well-aerated and clear. There is no evidence of focal opacification, pleural effusion or pneumothorax. The heart is normal in size; the mediastinal contour is within normal limits. No acute osseous abnormalities are seen. IMPRESSION: No acute cardiopulmonary process seen. Electronically Signed   By: Garald Balding M.D.   On: 11/13/2016 01:33    Procedures Procedures (including critical care time)  Medications Ordered in ED Medications  albuterol (PROVENTIL) (2.5 MG/3ML) 0.083% nebulizer solution 5 mg (not administered)  ipratropium (ATROVENT) nebulizer solution 0.5 mg (not administered)  prednisoLONE (ORAPRED) 15 MG/5ML solution 60 mg (not  administered)  acetaminophen (TYLENOL) solution 1,000 mg (not administered)     Initial Impression / Assessment and Plan / ED Course  I have reviewed the triage vital signs and the nursing notes.  Pertinent labs & imaging results that were available during my care of the patient were reviewed by me and considered in my medical decision making (see chart for details).     Patient presents with chest pain for the past 3 days that is worse with position and movement. Labs, EKG, CXR reassuring. Doubt ACS. Discussed with Dr. Roxanne Mins. Will start on prednisone, encourage follow up with primary care.   Final Clinical Impressions(s) / ED Diagnoses   Final diagnoses:  None   1. Chest wall pain 2. History of sarcoid   New Prescriptions New Prescriptions   No medications on file     Dennie Bible 78/24/23 [id number]    Delora Fuel, MD 44/31/54 [id number]

## 2016-11-20 NOTE — ED Provider Notes (Signed)
[facility] DEPT Provider Note   CSN: [id number] Arrival date & time: 11/13/16  0041     History   Chief Complaint Chief Complaint  Patient presents with  . Chest Pain    HPI Rebecca Sanchez is a 46 y.o. female.  HPI  Past Medical History:  Diagnosis Date  . Anemia   . Asthma   . Chest pain    a. 04/2012 Echo: EF 65%, nl wall motion.  . Chronic bronchitis   . GERD (gastroesophageal reflux disease)   . Heart murmur   . HTN (hypertension)   . Sarcoid   . Thyromegaly     Patient Active Problem List   Diagnosis Date Noted  . Vaginitis and vulvovaginitis, unspecified 04/18/2013  . Internal hemorrhoids with prolapse & pain 12/23/2012  . Chest pain 06/17/2012  . Sarcoid 06/17/2012  . Chronic bronchitis     Past Surgical History:  Procedure Laterality Date  . BUNIONECTOMY    . HERNIA REPAIR      OB History    Gravida Para Term Preterm AB Living   4 3 3   1      SAB TAB Ectopic Multiple Live Births   1               Home Medications    Prior to Admission medications   Medication Sig Start Date End Date Taking? Authorizing Provider  albuterol (PROAIR HFA) 108 (90 BASE) MCG/ACT inhaler Inhale 2 puffs into the lungs every 6 (six) hours as needed for wheezing or shortness of breath.    Yes [provider]  Cholecalciferol (VITAMIN D-1000 MAX ST) 1000 units tablet Take 1 tablet by mouth daily.   Yes [provider]  clotrimazole (LOTRIMIN) 1 % cream Apply 1 application topically 2 (two) times daily.   Yes [provider]  ferrous sulfate 300 (60 Fe) MG/5ML syrup Take 300 mg by mouth daily.   Yes [provider]  fluticasone (FLONASE) 50 MCG/ACT nasal spray Place 2 sprays into both nostrils 2 (two) times daily as needed for allergies.    Yes [provider]  amoxicillin (AMOXIL) 500 MG capsule Take 4 capsules (2,000 mg total) by mouth once. Patient not taking: Reported on 07/23/2014 07/18/14   Shelly Bombard, MD    diphenhydrAMINE (BENADRYL) 25 MG tablet Take 1 tablet (25 mg total) by mouth every 6 (six) hours. Patient not taking: Reported on 04/05/2015 08/07/14   Patel-Mills, Orvil Feil, PA-C  famotidine (PEPCID) 20 MG tablet Take 1 tablet (20 mg total) by mouth 2 (two) times daily. Patient not taking: Reported on 06/09/2014 05/27/14   Charlesetta Shanks, MD  lidocaine (XYLOCAINE) 2 % solution Use as directed 20 mLs in the mouth or throat as needed for mouth pain. Patient not taking: Reported on 11/13/2016 04/05/15   Margarita Mail, PA-C  Oxycodone HCl 10 MG TABS Take 1 tablet (10 mg total) by mouth every 6 (six) hours as needed. Patient not taking: Reported on 07/18/2014 06/23/14   Shelly Bombard, MD  predniSONE (STERAPRED UNI-PAK) 10 MG tablet Take by mouth taper from 4 doses each day to 1 dose and stop. Patient not taking: Reported on 04/05/2015 08/07/14   Ottie Glazier, PA-C    Family History Family History  Problem Relation Age of Onset  . Hypertension Mother   . Hypertension Father   . Cancer Maternal Grandmother        lung  . Cancer Paternal Grandmother        lung  Social History Social History  Substance Use Topics  . Smoking status: Never Smoker  . Smokeless tobacco: Never Used  . Alcohol use No     Allergies   Atenolol; Azithromycin; Motrin [ibuprofen]; Peanut oil; Peanut-containing drug products; Propranolol; Tapazole [methimazole]; and Zyrtec [cetirizine]   Review of Systems Review of Systems   Physical Exam Updated Vital Signs BP 130/83   Pulse 75   Temp 98 F (36.7 C) (Oral)   Resp 15   Ht 5\' 4"  (1.626 m)   Wt 49.9 kg (110 lb)   LMP 11/10/2016   SpO2 100%   BMI 18.88 kg/m   Physical Exam   ED Treatments / Results  Labs (all labs ordered are listed, but only abnormal results are displayed) Labs Reviewed  BASIC METABOLIC PANEL - Abnormal; Notable for the following:       Result Value   Potassium 3.4 (*)    All other components within normal limits   CBC - Abnormal; Notable for the following:    Platelets 415 (*)    All other components within normal limits  D-DIMER, QUANTITATIVE (NOT AT [facility])  I-STAT TROPOININ, ED  POCT I-STAT TROPONIN I    EKG  EKG Interpretation  Date/Time:  Thursday November 13 2016 00:50:12 EDT Ventricular Rate:  73 PR Interval:    QRS Duration: 128 QT Interval:  426 QTC Calculation: 470 R Axis:   80 Text Interpretation:  Sinus rhythm Short PR interval Otherwise within normal limits When compared with ECG of 08/16/2014, No significant change was found Confirmed by Delora Fuel ([id number]) on 11/13/2016 1:04:14 AM       Radiology No results found.  Procedures Procedures (including critical care time)  Medications Ordered in ED Medications  albuterol (PROVENTIL) (2.5 MG/3ML) 0.083% nebulizer solution 5 mg (5 mg Nebulization Given 11/13/16 0241)  ipratropium (ATROVENT) nebulizer solution 0.5 mg (0.5 mg Nebulization Given 11/13/16 0241)  acetaminophen (TYLENOL) solution 1,000 mg (1,000 mg Oral Given 11/13/16 0327)  prednisoLONE (ORAPRED) 15 MG/5ML solution 40 mg (40 mg Oral Given 11/13/16 0339)     Initial Impression / Assessment and Plan / ED Course  I have reviewed the triage vital signs and the nursing notes.  Pertinent labs & imaging results that were available during my care of the patient were reviewed by me and considered in my medical decision making (see chart for details).     Patient with a history of sarcoid presents with chest tightness. No fever.   The patient appears comfortable, VSS. CXR clear, labs essentially unremarkable, including troponin and d-dimer. Nebulizer treatment provided with some relief. She will be started on prednisone and encouraged to follow up with PCP.    Final Clinical Impressions(s) / ED Diagnoses   Final diagnoses:  Chest wall pain  History of sarcoidosis    New Prescriptions Discharge Medication List as of 11/13/2016  6:10 AM    START taking these medications    Details  prednisoLONE (PRELONE) 15 MG/5ML SOLN Take 13.3 mLs (40 mg total) by mouth daily before breakfast., Starting Thu 11/13/2016, Until Mon 11/17/2016, Print         Charlann Lange, PA-C 09/98/33 8250    Delora Fuel, MD 53/97/67 [phone]

## 2017-07-02 DIAGNOSIS — J302 Other seasonal allergic rhinitis: Secondary | ICD-10-CM | POA: Diagnosis not present

## 2017-07-02 DIAGNOSIS — D860 Sarcoidosis of lung: Secondary | ICD-10-CM | POA: Diagnosis not present

## 2017-07-02 DIAGNOSIS — Z0000 Encounter for general adult medical examination without abnormal findings: Secondary | ICD-10-CM | POA: Diagnosis not present

## 2017-07-02 DIAGNOSIS — Z0001 Encounter for general adult medical examination with abnormal findings: Secondary | ICD-10-CM | POA: Diagnosis not present

## 2017-07-02 DIAGNOSIS — E559 Vitamin D deficiency, unspecified: Secondary | ICD-10-CM | POA: Diagnosis not present

## 2017-07-02 DIAGNOSIS — D509 Iron deficiency anemia, unspecified: Secondary | ICD-10-CM | POA: Diagnosis not present

## 2017-07-02 DIAGNOSIS — Z01118 Encounter for examination of ears and hearing with other abnormal findings: Secondary | ICD-10-CM | POA: Diagnosis not present

## 2017-07-02 DIAGNOSIS — K30 Functional dyspepsia: Secondary | ICD-10-CM | POA: Diagnosis not present

## 2017-07-02 DIAGNOSIS — E538 Deficiency of other specified B group vitamins: Secondary | ICD-10-CM | POA: Diagnosis not present

## 2017-07-02 DIAGNOSIS — I10 Essential (primary) hypertension: Secondary | ICD-10-CM | POA: Diagnosis not present

## 2017-07-02 DIAGNOSIS — E0590 Thyrotoxicosis, unspecified without thyrotoxic crisis or storm: Secondary | ICD-10-CM | POA: Diagnosis not present

## 2017-07-02 DIAGNOSIS — J45909 Unspecified asthma, uncomplicated: Secondary | ICD-10-CM | POA: Diagnosis not present

## 2017-07-02 DIAGNOSIS — H539 Unspecified visual disturbance: Secondary | ICD-10-CM | POA: Diagnosis not present

## 2017-07-02 DIAGNOSIS — J4540 Moderate persistent asthma, uncomplicated: Secondary | ICD-10-CM | POA: Diagnosis not present

## 2017-07-06 DIAGNOSIS — E0520 Thyrotoxicosis with toxic multinodular goiter without thyrotoxic crisis or storm: Secondary | ICD-10-CM | POA: Diagnosis not present

## 2017-07-06 DIAGNOSIS — E042 Nontoxic multinodular goiter: Secondary | ICD-10-CM | POA: Diagnosis not present

## 2017-07-06 DIAGNOSIS — E0500 Thyrotoxicosis with diffuse goiter without thyrotoxic crisis or storm: Secondary | ICD-10-CM | POA: Diagnosis not present

## 2017-07-06 DIAGNOSIS — E559 Vitamin D deficiency, unspecified: Secondary | ICD-10-CM | POA: Diagnosis not present

## 2017-07-22 DIAGNOSIS — E041 Nontoxic single thyroid nodule: Secondary | ICD-10-CM | POA: Diagnosis not present

## 2017-09-09 DIAGNOSIS — E559 Vitamin D deficiency, unspecified: Secondary | ICD-10-CM | POA: Diagnosis not present

## 2017-09-09 DIAGNOSIS — J302 Other seasonal allergic rhinitis: Secondary | ICD-10-CM | POA: Diagnosis not present

## 2017-09-09 DIAGNOSIS — I10 Essential (primary) hypertension: Secondary | ICD-10-CM | POA: Diagnosis not present

## 2017-09-09 DIAGNOSIS — D860 Sarcoidosis of lung: Secondary | ICD-10-CM | POA: Diagnosis not present

## 2017-09-09 DIAGNOSIS — K30 Functional dyspepsia: Secondary | ICD-10-CM | POA: Diagnosis not present

## 2017-09-09 DIAGNOSIS — E538 Deficiency of other specified B group vitamins: Secondary | ICD-10-CM | POA: Diagnosis not present

## 2017-09-09 DIAGNOSIS — E0590 Thyrotoxicosis, unspecified without thyrotoxic crisis or storm: Secondary | ICD-10-CM | POA: Diagnosis not present

## 2017-09-09 DIAGNOSIS — J45909 Unspecified asthma, uncomplicated: Secondary | ICD-10-CM | POA: Diagnosis not present

## 2017-09-09 DIAGNOSIS — D509 Iron deficiency anemia, unspecified: Secondary | ICD-10-CM | POA: Diagnosis not present

## 2017-12-07 DIAGNOSIS — E559 Vitamin D deficiency, unspecified: Secondary | ICD-10-CM | POA: Diagnosis not present

## 2017-12-07 DIAGNOSIS — I10 Essential (primary) hypertension: Secondary | ICD-10-CM | POA: Diagnosis not present

## 2017-12-07 DIAGNOSIS — Z9189 Other specified personal risk factors, not elsewhere classified: Secondary | ICD-10-CM | POA: Diagnosis not present

## 2017-12-07 DIAGNOSIS — J302 Other seasonal allergic rhinitis: Secondary | ICD-10-CM | POA: Diagnosis not present

## 2017-12-07 DIAGNOSIS — E0590 Thyrotoxicosis, unspecified without thyrotoxic crisis or storm: Secondary | ICD-10-CM | POA: Diagnosis not present

## 2017-12-07 DIAGNOSIS — D509 Iron deficiency anemia, unspecified: Secondary | ICD-10-CM | POA: Diagnosis not present

## 2017-12-07 DIAGNOSIS — K30 Functional dyspepsia: Secondary | ICD-10-CM | POA: Diagnosis not present

## 2017-12-07 DIAGNOSIS — Z131 Encounter for screening for diabetes mellitus: Secondary | ICD-10-CM | POA: Diagnosis not present

## 2017-12-07 DIAGNOSIS — J45909 Unspecified asthma, uncomplicated: Secondary | ICD-10-CM | POA: Diagnosis not present

## 2017-12-07 DIAGNOSIS — E538 Deficiency of other specified B group vitamins: Secondary | ICD-10-CM | POA: Diagnosis not present

## 2017-12-07 DIAGNOSIS — D860 Sarcoidosis of lung: Secondary | ICD-10-CM | POA: Diagnosis not present

## 2017-12-08 DIAGNOSIS — I10 Essential (primary) hypertension: Secondary | ICD-10-CM | POA: Diagnosis not present

## 2017-12-08 DIAGNOSIS — D860 Sarcoidosis of lung: Secondary | ICD-10-CM | POA: Diagnosis not present

## 2017-12-08 DIAGNOSIS — E559 Vitamin D deficiency, unspecified: Secondary | ICD-10-CM | POA: Diagnosis not present

## 2017-12-08 DIAGNOSIS — K30 Functional dyspepsia: Secondary | ICD-10-CM | POA: Diagnosis not present

## 2017-12-08 DIAGNOSIS — E538 Deficiency of other specified B group vitamins: Secondary | ICD-10-CM | POA: Diagnosis not present

## 2017-12-08 DIAGNOSIS — E0590 Thyrotoxicosis, unspecified without thyrotoxic crisis or storm: Secondary | ICD-10-CM | POA: Diagnosis not present

## 2017-12-08 DIAGNOSIS — J302 Other seasonal allergic rhinitis: Secondary | ICD-10-CM | POA: Diagnosis not present

## 2017-12-08 DIAGNOSIS — J45909 Unspecified asthma, uncomplicated: Secondary | ICD-10-CM | POA: Diagnosis not present

## 2017-12-08 DIAGNOSIS — D509 Iron deficiency anemia, unspecified: Secondary | ICD-10-CM | POA: Diagnosis not present

## 2017-12-30 ENCOUNTER — Other Ambulatory Visit (INDEPENDENT_AMBULATORY_CARE_PROVIDER_SITE_OTHER): Payer: Medicare Other

## 2017-12-30 ENCOUNTER — Ambulatory Visit (INDEPENDENT_AMBULATORY_CARE_PROVIDER_SITE_OTHER): Payer: Medicare Other | Admitting: Pulmonary Disease

## 2017-12-30 ENCOUNTER — Encounter: Payer: Self-pay | Admitting: Pulmonary Disease

## 2017-12-30 VITALS — BP 120/80 | HR 91 | Ht 64.0 in | Wt 106.0 lb

## 2017-12-30 DIAGNOSIS — D869 Sarcoidosis, unspecified: Secondary | ICD-10-CM | POA: Diagnosis not present

## 2017-12-30 DIAGNOSIS — G4733 Obstructive sleep apnea (adult) (pediatric): Secondary | ICD-10-CM | POA: Diagnosis not present

## 2017-12-30 LAB — COMPREHENSIVE METABOLIC PANEL
ALT: 9 U/L (ref 0–35)
AST: 15 U/L (ref 0–37)
Albumin: 4.6 g/dL (ref 3.5–5.2)
Alkaline Phosphatase: 44 U/L (ref 39–117)
BUN: 5 mg/dL — ABNORMAL LOW (ref 6–23)
CO2: 30 mEq/L (ref 19–32)
Calcium: 9.7 mg/dL (ref 8.4–10.5)
Chloride: 102 mEq/L (ref 96–112)
Creatinine, Ser: 0.84 mg/dL (ref 0.40–1.20)
GFR: 92.65 mL/min (ref >60.00)
Glucose, Bld: 89 mg/dL (ref 70–99)
Potassium: 3.9 mEq/L (ref 3.5–5.1)
Sodium: 138 mEq/L (ref 135–145)
Total Bilirubin: 0.5 mg/dL (ref 0.2–1.2)
Total Protein: 8.1 g/dL (ref 6.0–8.3)

## 2017-12-30 LAB — SEDIMENTATION RATE: Sed Rate: 8 mm/hr (ref 0–20)

## 2017-12-30 NOTE — Progress Notes (Signed)
Subjective:    Patient ID: Rebecca Sanchez, female    DOB: 27-Nov-1970, 47 y.o.   MRN: [id number]  Chief complaint:  History of sarcoidosis Witnessed apneas, aches and pains and fatigue  HPI  Patient with multiple comorbidities Witnessed apneas, denies significant daytime sleepiness however does get sleepy on occasions Has a lot of musculoskeletal pains and discomfort Diagnosed sarcoidosis in the past Denies being aware of snoring She does get short of breath with some exertion Complains of some chest pressure  Denies significant family history of sleep apnea  She had a study done about 4 - 5 years ago-she states she is not aware of what the study actually showed, no treatment was recommended    Past Medical History:  Diagnosis Date  . Anemia   . Asthma   . Chest pain    a. 04/2012 Echo: EF 65%, nl wall motion.  . Chronic bronchitis   . GERD (gastroesophageal reflux disease)   . Heart murmur   . HTN (hypertension)   . Sarcoid   . Thyromegaly    Current Outpatient Medications on File Prior to Visit  Medication Sig Dispense Refill  . albuterol (PROAIR HFA) 108 (90 BASE) MCG/ACT inhaler Inhale 2 puffs into the lungs every 6 (six) hours as needed for wheezing or shortness of breath.     . Cholecalciferol (VITAMIN D-1000 MAX ST) 1000 units tablet Take 1 tablet by mouth daily.    . clotrimazole (LOTRIMIN) 1 % cream Apply 1 application topically 2 (two) times daily.    . ferrous sulfate 300 (60 Fe) MG/5ML syrup Take 300 mg by mouth daily.    . fluticasone (FLONASE) 50 MCG/ACT nasal spray Place 2 sprays into both nostrils 2 (two) times daily as needed for allergies.      No current facility-administered medications on file prior to visit.    Family History  Problem Relation Age of Onset  . Hypertension Mother   . Hypertension Father   . Cancer Maternal Grandmother        lung  . Cancer Paternal Grandmother        lung   Social History   Socioeconomic History  .  Marital status: Single    Spouse name: Not on file  . Number of children: Not on file  . Years of education: Not on file  . Highest education level: Not on file  Occupational History  . Not on file  Social Needs  . Financial resource strain: Not on file  . Food insecurity:    Worry: Not on file    Inability: Not on file  . Transportation needs:    Medical: Not on file    Non-medical: Not on file  Tobacco Use  . Smoking status: Never Smoker  . Smokeless tobacco: Never Used  Substance and Sexual Activity  . Alcohol use: No  . Drug use: No  . Sexual activity: Not Currently    Partners: Male    Birth control/protection: None  Lifestyle  . Physical activity:    Days per week: Not on file    Minutes per session: Not on file  . Stress: Not on file  Relationships  . Social connections:    Talks on phone: Not on file    Gets together: Not on file    Attends religious service: Not on file    Active member of club or organization: Not on file    Attends meetings of clubs or organizations: Not on file  Relationship status: Not on file  . Intimate partner violence:    Fear of current or ex partner: Not on file    Emotionally abused: Not on file    Physically abused: Not on file    Forced sexual activity: Not on file  Other Topics Concern  . Not on file  Social History Narrative  . Not on file     Review of Systems  Constitutional: Negative for fever and unexpected weight change.  HENT: Positive for dental problem and ear pain. Negative for congestion, nosebleeds, postnasal drip, rhinorrhea, sinus pressure, sneezing, sore throat and trouble swallowing.   Eyes: Negative for redness and itching.  Respiratory: Positive for chest tightness. Negative for cough, shortness of breath and wheezing.   Cardiovascular: Positive for palpitations. Negative for leg swelling.  Gastrointestinal: Negative for nausea and vomiting.  Genitourinary: Negative for dysuria.  Musculoskeletal:  Negative for joint swelling.  Skin: Negative for rash.  Allergic/Immunologic: Positive for environmental allergies and food allergies. Negative for immunocompromised state.  Neurological: Positive for headaches.  Hematological: Does not bruise/bleed easily.  Psychiatric/Behavioral: Negative for dysphoric mood. The patient is not nervous/anxious.        Vitals:   12/30/17 1201  BP: 120/80  Pulse: 91  SpO2: 99%    Objective:   Physical Exam  Constitutional: She is oriented to person, place, and time. She appears well-developed and well-nourished. No distress.  HENT:  Head: Normocephalic and atraumatic.  Eyes: Pupils are equal, round, and reactive to light. Conjunctivae and EOM are normal. Right eye exhibits no discharge. Left eye exhibits no discharge.  Neck: Normal range of motion. Neck supple. No thyromegaly present.  Cardiovascular: Normal rate and regular rhythm. Exam reveals no friction rub.  No murmur heard. Pulmonary/Chest: Effort normal and breath sounds normal. No stridor. No respiratory distress. She has no wheezes. She has no rales.  Abdominal: Soft. Bowel sounds are normal. She exhibits no distension. There is no tenderness.  Musculoskeletal: Normal range of motion. She exhibits no edema or deformity.  Neurological: She is alert and oriented to person, place, and time. No cranial nerve deficit. Coordination abnormal.  Skin: Skin is warm and dry. She is not diaphoretic. No erythema. No pallor.  Psychiatric: She has a normal mood and affect.   Data reviewed: CT angios from 11/24/2015-results from [facility] reveals no adenopathy, no pulmonary infiltrate, no scarring  CT from 2012 did reveal multiple nodules-nonspecific    Assessment & Plan:  .  Sarcoidosis -Abnormal CT in the past, most recent one did show some improvement  .  Possible obstructive sleep apnea -She does not have the typical body habitus of somebody with sleep apnea however has been noted to snore and  apneas have been witnessed by her son  .  Atypical chest pain   Plan: .  Obtain home sleep study  .  Obtain a pulmonary function study  .  Obtain a CT scan of the chest with contrast  .  ACE level, ESR, metabolic profile  Continue inhaler use  Symptoms are not consistent with severe obstructive sleep apnea however needs to be ruled out  We did discuss sarcoidosis and treatment Discussed pathophysiology of sleep disordered breathing  I will see her back in the office in about 3 months

## 2017-12-30 NOTE — Patient Instructions (Signed)
History of sarcoidosis--on inhalers  Witnessed apneas Had a sleep study about 4 years ago-was not prescribed any devices   Obtain pulmonary function study  Obtain home sleep study-rule out significant sleep disordered breathing  Obtain ACE level  Obtain metabolic profile  Obtain CT scan of the chest with contrast  I will see her back in the office in 3 months We will update you as studies are finalized

## 2018-01-01 LAB — ANGIOTENSIN CONVERTING ENZYME: Angiotensin-Converting Enzyme: 29 U/L (ref 9–67)

## 2018-01-05 ENCOUNTER — Telehealth: Payer: Self-pay | Admitting: Pulmonary Disease

## 2018-01-05 NOTE — Telephone Encounter (Signed)
Called and spoke with patient regarding results.  Informed the patient of results and recommendations today. Pt verbalized understanding and denied any questions or concerns at this time.  Nothing further needed.  

## 2018-01-11 ENCOUNTER — Inpatient Hospital Stay: Admission: RE | Admit: 2018-01-11 | Payer: Medicare Other | Source: Ambulatory Visit

## 2018-01-21 DIAGNOSIS — R0681 Apnea, not elsewhere classified: Secondary | ICD-10-CM | POA: Diagnosis not present

## 2018-01-22 ENCOUNTER — Other Ambulatory Visit: Payer: Self-pay | Admitting: *Deleted

## 2018-01-22 DIAGNOSIS — G4733 Obstructive sleep apnea (adult) (pediatric): Secondary | ICD-10-CM

## 2018-01-22 DIAGNOSIS — R0681 Apnea, not elsewhere classified: Secondary | ICD-10-CM

## 2018-01-26 ENCOUNTER — Telehealth: Payer: Self-pay | Admitting: Pulmonary Disease

## 2018-01-26 NOTE — Telephone Encounter (Signed)
Dr. Ander Slade has reviewed the home sleep test this test was negative for sleep apnea.  Dr . Ander Slade recommendation are if symptoms continue we may need to due a polysomnogram done.   Caution against driving when sleepy and against taking medication with sedative side effects.  Follow up as needed.    ATC patient will call back.

## 2018-01-26 NOTE — Telephone Encounter (Signed)
Called and spoke to patient. Relayed results of HST. Patient verbalized understanding. Nothing further needed.

## 2018-01-26 NOTE — Telephone Encounter (Signed)
Pt returning call; pt contact number [phone]

## 2018-02-04 DIAGNOSIS — F458 Other somatoform disorders: Secondary | ICD-10-CM | POA: Diagnosis not present

## 2018-02-04 DIAGNOSIS — Z79899 Other long term (current) drug therapy: Secondary | ICD-10-CM | POA: Diagnosis not present

## 2018-02-04 DIAGNOSIS — E041 Nontoxic single thyroid nodule: Secondary | ICD-10-CM | POA: Diagnosis not present

## 2018-02-04 DIAGNOSIS — E042 Nontoxic multinodular goiter: Secondary | ICD-10-CM | POA: Diagnosis not present

## 2018-02-04 DIAGNOSIS — E559 Vitamin D deficiency, unspecified: Secondary | ICD-10-CM | POA: Diagnosis not present

## 2018-02-04 DIAGNOSIS — E0500 Thyrotoxicosis with diffuse goiter without thyrotoxic crisis or storm: Secondary | ICD-10-CM | POA: Diagnosis not present

## 2018-02-04 DIAGNOSIS — Z8639 Personal history of other endocrine, nutritional and metabolic disease: Secondary | ICD-10-CM | POA: Diagnosis not present

## 2018-02-09 DIAGNOSIS — Z8639 Personal history of other endocrine, nutritional and metabolic disease: Secondary | ICD-10-CM | POA: Diagnosis not present

## 2018-02-10 DIAGNOSIS — N6322 Unspecified lump in the left breast, upper inner quadrant: Secondary | ICD-10-CM | POA: Diagnosis not present

## 2018-02-10 DIAGNOSIS — Z01419 Encounter for gynecological examination (general) (routine) without abnormal findings: Secondary | ICD-10-CM | POA: Diagnosis not present

## 2018-02-10 DIAGNOSIS — N6019 Diffuse cystic mastopathy of unspecified breast: Secondary | ICD-10-CM | POA: Diagnosis not present

## 2018-02-10 DIAGNOSIS — Z124 Encounter for screening for malignant neoplasm of cervix: Secondary | ICD-10-CM | POA: Diagnosis not present

## 2018-02-11 DIAGNOSIS — Z124 Encounter for screening for malignant neoplasm of cervix: Secondary | ICD-10-CM | POA: Diagnosis not present

## 2018-02-12 ENCOUNTER — Other Ambulatory Visit: Payer: Self-pay | Admitting: Gynecology

## 2018-02-12 DIAGNOSIS — N6320 Unspecified lump in the left breast, unspecified quadrant: Secondary | ICD-10-CM

## 2018-03-12 DIAGNOSIS — J45909 Unspecified asthma, uncomplicated: Secondary | ICD-10-CM | POA: Diagnosis not present

## 2018-03-12 DIAGNOSIS — E559 Vitamin D deficiency, unspecified: Secondary | ICD-10-CM | POA: Diagnosis not present

## 2018-03-12 DIAGNOSIS — K30 Functional dyspepsia: Secondary | ICD-10-CM | POA: Diagnosis not present

## 2018-03-12 DIAGNOSIS — E538 Deficiency of other specified B group vitamins: Secondary | ICD-10-CM | POA: Diagnosis not present

## 2018-03-12 DIAGNOSIS — I10 Essential (primary) hypertension: Secondary | ICD-10-CM | POA: Diagnosis not present

## 2018-03-12 DIAGNOSIS — D860 Sarcoidosis of lung: Secondary | ICD-10-CM | POA: Diagnosis not present

## 2018-03-12 DIAGNOSIS — Z9189 Other specified personal risk factors, not elsewhere classified: Secondary | ICD-10-CM | POA: Diagnosis not present

## 2018-03-12 DIAGNOSIS — J302 Other seasonal allergic rhinitis: Secondary | ICD-10-CM | POA: Diagnosis not present

## 2018-03-12 DIAGNOSIS — E0590 Thyrotoxicosis, unspecified without thyrotoxic crisis or storm: Secondary | ICD-10-CM | POA: Diagnosis not present

## 2018-03-12 DIAGNOSIS — D509 Iron deficiency anemia, unspecified: Secondary | ICD-10-CM | POA: Diagnosis not present

## 2018-04-30 ENCOUNTER — Ambulatory Visit
Admission: RE | Admit: 2018-04-30 | Discharge: 2018-04-30 | Disposition: A | Discharge status: Home / Self Care | Payer: Medicare Other | Source: Ambulatory Visit | Attending: Gynecology | Admitting: Gynecology

## 2018-04-30 DIAGNOSIS — R922 Inconclusive mammogram: Secondary | ICD-10-CM | POA: Diagnosis not present

## 2018-04-30 DIAGNOSIS — N6002 Solitary cyst of left breast: Secondary | ICD-10-CM | POA: Diagnosis not present

## 2018-04-30 DIAGNOSIS — N6320 Unspecified lump in the left breast, unspecified quadrant: Secondary | ICD-10-CM

## 2018-07-07 DIAGNOSIS — E538 Deficiency of other specified B group vitamins: Secondary | ICD-10-CM | POA: Diagnosis not present

## 2018-07-07 DIAGNOSIS — J302 Other seasonal allergic rhinitis: Secondary | ICD-10-CM | POA: Diagnosis not present

## 2018-07-07 DIAGNOSIS — E0590 Thyrotoxicosis, unspecified without thyrotoxic crisis or storm: Secondary | ICD-10-CM | POA: Diagnosis not present

## 2018-07-07 DIAGNOSIS — E559 Vitamin D deficiency, unspecified: Secondary | ICD-10-CM | POA: Diagnosis not present

## 2018-07-07 DIAGNOSIS — Z9189 Other specified personal risk factors, not elsewhere classified: Secondary | ICD-10-CM | POA: Diagnosis not present

## 2018-07-07 DIAGNOSIS — Z0001 Encounter for general adult medical examination with abnormal findings: Secondary | ICD-10-CM | POA: Diagnosis not present

## 2018-07-07 DIAGNOSIS — I10 Essential (primary) hypertension: Secondary | ICD-10-CM | POA: Diagnosis not present

## 2018-07-07 DIAGNOSIS — K30 Functional dyspepsia: Secondary | ICD-10-CM | POA: Diagnosis not present

## 2018-07-07 DIAGNOSIS — D860 Sarcoidosis of lung: Secondary | ICD-10-CM | POA: Diagnosis not present

## 2018-07-07 DIAGNOSIS — J45909 Unspecified asthma, uncomplicated: Secondary | ICD-10-CM | POA: Diagnosis not present

## 2018-07-07 DIAGNOSIS — D509 Iron deficiency anemia, unspecified: Secondary | ICD-10-CM | POA: Diagnosis not present

## 2018-11-02 DIAGNOSIS — E559 Vitamin D deficiency, unspecified: Secondary | ICD-10-CM | POA: Diagnosis not present

## 2018-11-02 DIAGNOSIS — D860 Sarcoidosis of lung: Secondary | ICD-10-CM | POA: Diagnosis not present

## 2018-11-02 DIAGNOSIS — K30 Functional dyspepsia: Secondary | ICD-10-CM | POA: Diagnosis not present

## 2018-11-02 DIAGNOSIS — E538 Deficiency of other specified B group vitamins: Secondary | ICD-10-CM | POA: Diagnosis not present

## 2018-11-02 DIAGNOSIS — Z9189 Other specified personal risk factors, not elsewhere classified: Secondary | ICD-10-CM | POA: Diagnosis not present

## 2018-11-02 DIAGNOSIS — E0590 Thyrotoxicosis, unspecified without thyrotoxic crisis or storm: Secondary | ICD-10-CM | POA: Diagnosis not present

## 2018-11-02 DIAGNOSIS — D509 Iron deficiency anemia, unspecified: Secondary | ICD-10-CM | POA: Diagnosis not present

## 2018-11-02 DIAGNOSIS — I10 Essential (primary) hypertension: Secondary | ICD-10-CM | POA: Diagnosis not present

## 2018-11-02 DIAGNOSIS — J302 Other seasonal allergic rhinitis: Secondary | ICD-10-CM | POA: Diagnosis not present

## 2018-11-02 DIAGNOSIS — J0100 Acute maxillary sinusitis, unspecified: Secondary | ICD-10-CM | POA: Diagnosis not present

## 2018-11-02 DIAGNOSIS — J45909 Unspecified asthma, uncomplicated: Secondary | ICD-10-CM | POA: Diagnosis not present

## 2018-12-16 DIAGNOSIS — I10 Essential (primary) hypertension: Secondary | ICD-10-CM | POA: Diagnosis not present

## 2018-12-16 DIAGNOSIS — Z91010 Allergy to peanuts: Secondary | ICD-10-CM | POA: Diagnosis not present

## 2018-12-16 DIAGNOSIS — Z881 Allergy status to other antibiotic agents status: Secondary | ICD-10-CM | POA: Diagnosis not present

## 2018-12-16 DIAGNOSIS — D860 Sarcoidosis of lung: Secondary | ICD-10-CM | POA: Diagnosis not present

## 2018-12-16 DIAGNOSIS — S161XXA Strain of muscle, fascia and tendon at neck level, initial encounter: Secondary | ICD-10-CM | POA: Diagnosis not present

## 2018-12-16 DIAGNOSIS — Z888 Allergy status to other drugs, medicaments and biological substances status: Secondary | ICD-10-CM | POA: Diagnosis not present

## 2018-12-16 DIAGNOSIS — Z886 Allergy status to analgesic agent status: Secondary | ICD-10-CM | POA: Diagnosis not present

## 2018-12-16 DIAGNOSIS — R0789 Other chest pain: Secondary | ICD-10-CM | POA: Diagnosis not present

## 2019-04-20 DIAGNOSIS — I10 Essential (primary) hypertension: Secondary | ICD-10-CM | POA: Diagnosis not present

## 2019-04-25 ENCOUNTER — Other Ambulatory Visit: Payer: Self-pay | Admitting: Internal Medicine

## 2019-04-25 DIAGNOSIS — N644 Mastodynia: Secondary | ICD-10-CM

## 2019-05-12 ENCOUNTER — Ambulatory Visit
Admission: RE | Admit: 2019-05-12 | Discharge: 2019-05-12 | Disposition: A | Discharge status: Home / Self Care | Payer: Medicare Other | Source: Ambulatory Visit | Attending: Internal Medicine | Admitting: Internal Medicine

## 2019-05-12 ENCOUNTER — Other Ambulatory Visit: Payer: Self-pay

## 2019-05-12 DIAGNOSIS — N644 Mastodynia: Secondary | ICD-10-CM

## 2019-07-06 DIAGNOSIS — D509 Iron deficiency anemia, unspecified: Secondary | ICD-10-CM | POA: Diagnosis not present

## 2019-07-06 DIAGNOSIS — K30 Functional dyspepsia: Secondary | ICD-10-CM | POA: Diagnosis not present

## 2019-07-06 DIAGNOSIS — I10 Essential (primary) hypertension: Secondary | ICD-10-CM | POA: Diagnosis not present

## 2019-07-06 DIAGNOSIS — E559 Vitamin D deficiency, unspecified: Secondary | ICD-10-CM | POA: Diagnosis not present

## 2019-07-06 DIAGNOSIS — D860 Sarcoidosis of lung: Secondary | ICD-10-CM | POA: Diagnosis not present

## 2019-07-06 DIAGNOSIS — J302 Other seasonal allergic rhinitis: Secondary | ICD-10-CM | POA: Diagnosis not present

## 2019-07-06 DIAGNOSIS — E0590 Thyrotoxicosis, unspecified without thyrotoxic crisis or storm: Secondary | ICD-10-CM | POA: Diagnosis not present

## 2019-07-06 DIAGNOSIS — Z0001 Encounter for general adult medical examination with abnormal findings: Secondary | ICD-10-CM | POA: Diagnosis not present

## 2019-09-02 DIAGNOSIS — D860 Sarcoidosis of lung: Secondary | ICD-10-CM | POA: Diagnosis not present

## 2019-09-02 DIAGNOSIS — J302 Other seasonal allergic rhinitis: Secondary | ICD-10-CM | POA: Diagnosis not present

## 2019-09-02 DIAGNOSIS — I10 Essential (primary) hypertension: Secondary | ICD-10-CM | POA: Diagnosis not present

## 2019-09-02 DIAGNOSIS — K30 Functional dyspepsia: Secondary | ICD-10-CM | POA: Diagnosis not present

## 2019-09-02 DIAGNOSIS — E559 Vitamin D deficiency, unspecified: Secondary | ICD-10-CM | POA: Diagnosis not present

## 2019-09-02 DIAGNOSIS — E0590 Thyrotoxicosis, unspecified without thyrotoxic crisis or storm: Secondary | ICD-10-CM | POA: Diagnosis not present

## 2019-09-02 DIAGNOSIS — D509 Iron deficiency anemia, unspecified: Secondary | ICD-10-CM | POA: Diagnosis not present

## 2019-09-15 ENCOUNTER — Telehealth: Payer: Self-pay | Admitting: Family

## 2019-09-15 NOTE — Telephone Encounter (Signed)
lmom to inform patient of referral new patient appointment on 5/5 at 1030 am

## 2019-09-28 ENCOUNTER — Other Ambulatory Visit: Payer: Self-pay

## 2019-09-28 ENCOUNTER — Inpatient Hospital Stay: Payer: Medicare Other | Attending: Family | Admitting: Family

## 2019-09-28 ENCOUNTER — Encounter: Payer: Self-pay | Admitting: Family

## 2019-09-28 ENCOUNTER — Inpatient Hospital Stay: Payer: Medicare Other

## 2019-09-28 ENCOUNTER — Other Ambulatory Visit: Payer: Self-pay | Admitting: Family

## 2019-09-28 ENCOUNTER — Telehealth: Payer: Self-pay | Admitting: Family

## 2019-09-28 VITALS — BP 169/77 | HR 78 | Temp 97.1°F | Resp 17 | Ht 63.0 in

## 2019-09-28 DIAGNOSIS — J449 Chronic obstructive pulmonary disease, unspecified: Secondary | ICD-10-CM | POA: Insufficient documentation

## 2019-09-28 DIAGNOSIS — D539 Nutritional anemia, unspecified: Secondary | ICD-10-CM | POA: Diagnosis not present

## 2019-09-28 DIAGNOSIS — I10 Essential (primary) hypertension: Secondary | ICD-10-CM | POA: Insufficient documentation

## 2019-09-28 DIAGNOSIS — E538 Deficiency of other specified B group vitamins: Secondary | ICD-10-CM | POA: Insufficient documentation

## 2019-09-28 DIAGNOSIS — R232 Flushing: Secondary | ICD-10-CM | POA: Diagnosis not present

## 2019-09-28 DIAGNOSIS — K5900 Constipation, unspecified: Secondary | ICD-10-CM | POA: Insufficient documentation

## 2019-09-28 DIAGNOSIS — M797 Fibromyalgia: Secondary | ICD-10-CM | POA: Diagnosis not present

## 2019-09-28 DIAGNOSIS — D649 Anemia, unspecified: Secondary | ICD-10-CM

## 2019-09-28 DIAGNOSIS — Z79899 Other long term (current) drug therapy: Secondary | ICD-10-CM | POA: Diagnosis not present

## 2019-09-28 DIAGNOSIS — K219 Gastro-esophageal reflux disease without esophagitis: Secondary | ICD-10-CM | POA: Insufficient documentation

## 2019-09-28 LAB — RETICULOCYTES
Immature Retic Fract: 3.2 % (ref 2.3–15.9)
RBC.: 4.25 MIL/uL (ref 3.87–5.11)
Retic Count, Absolute: 56.5 10*3/uL (ref 19.0–186.0)
Retic Ct Pct: 1.3 % (ref 0.4–3.1)

## 2019-09-28 LAB — CMP (CANCER CENTER ONLY)
ALT: 11 U/L (ref 0–44)
AST: 17 U/L (ref 15–41)
Albumin: 5 g/dL (ref 3.5–5.0)
Alkaline Phosphatase: 64 U/L (ref 38–126)
Anion gap: 8 (ref 5–15)
BUN: 10 mg/dL (ref 6–20)
CO2: 30 mmol/L (ref 22–32)
Calcium: 10.2 mg/dL (ref 8.9–10.3)
Chloride: 102 mmol/L (ref 98–111)
Creatinine: 0.85 mg/dL (ref 0.44–1.00)
GFR, Est AFR Am: >60 mL/min (ref >60)
GFR, Estimated: >60 mL/min (ref >60)
Glucose, Bld: 48 mg/dL — ABNORMAL LOW (ref 70–99)
Potassium: 3.4 mmol/L — ABNORMAL LOW (ref 3.5–5.1)
Sodium: 140 mmol/L (ref 135–145)
Total Bilirubin: 0.4 mg/dL (ref 0.3–1.2)
Total Protein: 8.1 g/dL (ref 6.5–8.1)

## 2019-09-28 LAB — CBC WITH DIFFERENTIAL (CANCER CENTER ONLY)
Abs Immature Granulocytes: 0.05 10*3/uL (ref 0.00–0.07)
Basophils Absolute: 0 10*3/uL (ref 0.0–0.1)
Basophils Relative: 1 %
Eosinophils Absolute: 0 10*3/uL (ref 0.0–0.5)
Eosinophils Relative: 1 %
HCT: 38.4 % (ref 36.0–46.0)
Hemoglobin: 13.1 g/dL (ref 12.0–15.0)
Immature Granulocytes: 1 %
Lymphocytes Relative: 39 %
Lymphs Abs: 1.4 10*3/uL (ref 0.7–4.0)
MCH: 30.6 pg (ref 26.0–34.0)
MCHC: 34.1 g/dL (ref 30.0–36.0)
MCV: 89.7 fL (ref 80.0–100.0)
Monocytes Absolute: 0.3 10*3/uL (ref 0.1–1.0)
Monocytes Relative: 10 %
Neutro Abs: 1.7 10*3/uL (ref 1.7–7.7)
Neutrophils Relative %: 48 %
Platelet Count: 348 10*3/uL (ref 150–400)
RBC: 4.28 MIL/uL (ref 3.87–5.11)
RDW: 11.4 % — ABNORMAL LOW (ref 11.5–15.5)
WBC Count: 3.5 10*3/uL — ABNORMAL LOW (ref 4.0–10.5)
nRBC: 0 % (ref 0.0–0.2)

## 2019-09-28 LAB — LACTATE DEHYDROGENASE: LDH: 138 U/L (ref 98–192)

## 2019-09-28 LAB — SAVE SMEAR (SSMR)

## 2019-09-28 NOTE — Telephone Encounter (Signed)
Called and LMVM for patient w/ updated appointments scheduled per 5/5 los

## 2019-09-28 NOTE — Progress Notes (Signed)
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  She last received Iv iron in 2001. She had not been on an oral supplement until this past week. She started taking liquid iron and today's Hgb is up to 13.1 and MCV 89.  She is not currently on B 12 supplement.  Her sister also has history of iron deficiency anemia and takes a supplement.  She states that she has several cousins with sickle cell disease and trait. She does not think she has ever been tested.  She has been diagnosed in the past with chronic pain and fatigue due to fibromyalgia.  She has not had a cycle in over a year. She has occasional hot flashes.  She has 3 children and no history of miscarriage.  She has constipation and will occasionally have bright red blood in her stool secondary to hemorrhoids.  No personal history of cancer. Family history includes: paternal aunt - pancreatic and paternal grandmother - lung (smoker).  She has had a partial thyroidectomy but is not currently on medication.  No fever, chills, n/v, cough, rash, dizziness, SOB, chest pain, palpitations, abdominal pain or changes in bowel or bladder habits.  No swelling, tenderness, numbness or tingling in her extremities.  No falls or syncope.  She does not smoke, use recreational drugs or drink alcoholic beverages.  She has maintained a good appetite and is staying well hydrated. Her weight is stable.  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  ROS: All other 10 point review of systems is  negative.   PAST MEDICAL HISTORY:   Past Medical History:  Diagnosis Date  . Anemia   . Asthma   . Chest pain    a. 04/2012 Echo: EF 65%, nl wall motion.  . Chronic bronchitis   . GERD (gastroesophageal reflux disease)   . Heart murmur   . HTN (hypertension)   . Sarcoid   . Thyromegaly     ALLERGIES: Allergies  Allergen Reactions  . Atenolol Shortness Of Breath    Throat swelling, confusion Throat swelling, confusion  . Azithromycin Shortness Of Breath and Hives  . Motrin [Ibuprofen] Shortness Of Breath and Hives  . Peanut Oil Anaphylaxis and Hives  . Peanut-Containing Drug Products Anaphylaxis  . Propranolol Shortness Of Breath    Throat swelling Throat swelling  . Tapazole [Methimazole] Shortness Of Breath    Throat swelling Throat swelling  . Zyrtec [Cetirizine] Shortness Of Breath and Hives    Throat swelling Throat swelling      MEDICATIONS:  Current Outpatient Medications on File Prior to Visit  Medication Sig Dispense Refill  . albuterol (PROAIR HFA) 108 (90 BASE) MCG/ACT inhaler Inhale 2 puffs into the lungs every 6 (six) hours as needed for wheezing or shortness of breath.     . Cholecalciferol (VITAMIN D-1000 MAX ST) 1000 units tablet Take 1 tablet by mouth daily.    . clotrimazole (LOTRIMIN) 1 % cream Apply 1 application topically 2 (two) times daily.    . ferrous sulfate  300 (60 Fe) MG/5ML syrup Take 300 mg by mouth daily.    . fluticasone (FLONASE) 50 MCG/ACT nasal spray Place 2 sprays into both nostrils 2 (two) times daily as needed for allergies.      No current facility-administered medications on file prior to visit.     PAST SURGICAL HISTORY Past Surgical History:  Procedure Laterality Date  . BREAST BIOPSY    . BUNIONECTOMY    . HERNIA REPAIR      FAMILY HISTORY: Family History  Problem Relation Age of Onset  . Hypertension Mother   . Hypertension Father   . Cancer Maternal Grandmother        lung  . Cancer Paternal Grandmother         lung  . Breast cancer Cousin     SOCIAL HISTORY:  reports that she has never smoked. She has never used smokeless tobacco. She reports that she does not drink alcohol or use drugs.  PERFORMANCE STATUS: The patient's performance status is 1 - Symptomatic but completely ambulatory  PHYSICAL EXAM: Most Recent Vital Signs: There were no vitals taken for this visit. There were no vitals taken for this visit.  General Appearance:    Alert, cooperative, no distress, appears stated age  Head:    Normocephalic, without obvious abnormality, atraumatic  Eyes:    PERRL, conjunctiva/corneas clear, EOM's intact, fundi    benign, both eyes        Throat:   Lips, mucosa, and tongue normal; teeth and gums normal  Neck:   Supple, symmetrical, trachea midline, no adenopathy;    thyroid:  no enlargement/tenderness/nodules; no carotid   bruit or JVD  Back:     Symmetric, no curvature, ROM normal, no CVA tenderness  Lungs:     Clear to auscultation bilaterally, respirations unlabored  Chest Wall:    No tenderness or deformity   Heart:    Regular rate and rhythm, S1 and S2 normal, no murmur, rub   or gallop     Abdomen:     Soft, non-tender, bowel sounds active all four quadrants,    no masses, no organomegaly        Extremities:   Extremities normal, atraumatic, no cyanosis or edema  Pulses:   2+ and symmetric all extremities  Skin:   Skin color, texture, turgor normal, no rashes or lesions  Lymph nodes:   Cervical, supraclavicular, and axillary nodes normal  Neurologic:   CNII-XII intact, normal strength, sensation and reflexes    throughout    LABORATORY DATA:  Results for orders placed or performed in visit on 09/28/19 (from the past 48 hour(s))  CBC with Differential (Cancer Center Only)     Status: Abnormal   Collection Time: 09/28/19 10:50 AM  Result Value Ref Range   WBC Count 3.5 (L) 4.0 - 10.5 K/uL   RBC 4.28 3.87 - 5.11 MIL/uL   Hemoglobin 13.1 12.0 - 15.0 g/dL   HCT 38.4 36.0  - 46.0 %   MCV 89.7 80.0 - 100.0 fL   MCH 30.6 26.0 - 34.0 pg   MCHC 34.1 30.0 - 36.0 g/dL   RDW 11.4 (L) 11.5 - 15.5 %   Platelet Count 348 150 - 400 K/uL   nRBC 0.0 0.0 - 0.2 %   Neutrophils Relative % 48 %   Neutro Abs 1.7 1.7 - 7.7 K/uL   Lymphocytes Relative 39 %   Lymphs Abs 1.4 0.7 - 4.0 K/uL   Monocytes Relative 10 %  Monocytes Absolute 0.3 0.1 - 1.0 K/uL   Eosinophils Relative 1 %   Eosinophils Absolute 0.0 0.0 - 0.5 K/uL   Basophils Relative 1 %   Basophils Absolute 0.0 0.0 - 0.1 K/uL   Immature Granulocytes 1 %   Abs Immature Granulocytes 0.05 0.00 - 0.07 K/uL    Comment: Performed at [facility] Lab at [facility], [address] [place], [place] [address]  Save Smear [facility])     Status: None   Collection Time: 09/28/19 10:50 AM  Result Value Ref Range   Smear Review SMEAR STAINED AND AVAILABLE FOR REVIEW     Comment: Performed at [facility] Lab at [facility], [address], [place], [place] [address]  Reticulocytes     Status: None   Collection Time: 09/28/19 10:51 AM  Result Value Ref Range   Retic Ct Pct 1.3 0.4 - 3.1 %   RBC. 4.25 3.87 - 5.11 MIL/uL   Retic Count, Absolute 56.5 19.0 - 186.0 K/uL   Immature Retic Fract 3.2 2.3 - 15.9 %    Comment: Performed at [facility] Lab at [facility], [address] [place],  [address]      RADIOGRAPHY: No results found.     PATHOLOGY: None  ASSESSMENT/PLAN: Ms. 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  Hgb and MCV have improved tremendously on oral iron supplement. She will continue this.  We will see what her hemoglobinopathy and alpha thalassemia testing show. Will have her start folic acid daily if needed.  We will go ahead and plan to see her back in 5 months.   All questions were answered and she is in agreement. She can contact our office with any  questions or concerns. We can certainly see her again if needed.   She was discussed with and also seen by Dr. Marin Olp and he is in agreement with the aforementioned.   Laverna Peace, NP   ADDENDUM: I saw and examined Ms. Bobby Rumpf with Judson Roch.  I must say that she is very nice.  It was quite enjoyable talking with her.  I looked at her blood smear under the microscope.  I really do not see anything that looked unusual.  She had normochromic and normocytic population of red blood cells.  There were no nucleated red blood cells.  I saw no teardrop cells.  She had no sickle cells or target cells.  White blood cells appeared normal in morphology maturation.  There were no hypersegmented polys.  There were no immature myeloid or lymphoid cells.  Platelets were adequate in number and size.  She is on oral iron right now.  This certainly does seem to be helping her.  She is not anemic at all.  She is not microcytic or macrocytic with her MCV.  I would not think that she has sickle cell disease.  She has 3 children.  She says none of them have sickle cell disease.  Her blood sugar is on the lower side.  She is not symptomatic with this.  For right now, I think it would be reasonable just to have her come back in about 4 months or so.  We will try to get her back after Labor Day.  I think we are okay to wait until after Labor Day.  If her blood counts are good at  that time, then we will let her go from the clinic.  We spent about 45 minutes with Ms. Hanners.  Again she was a lot of fun to talk to.  Lattie Haw, MD   Addendum:

## 2019-09-29 LAB — ERYTHROPOIETIN: Erythropoietin: 5.5 m[IU]/mL (ref 2.6–18.5)

## 2019-09-29 LAB — IRON AND TIBC
Iron: 63 ug/dL (ref 41–142)
Saturation Ratios: 18 % — ABNORMAL LOW (ref 21–57)
TIBC: 359 ug/dL (ref 236–444)
UIBC: 296 ug/dL (ref 120–384)

## 2019-09-29 LAB — FERRITIN: Ferritin: 64 ng/mL (ref 11–307)

## 2019-09-30 LAB — HGB FRACTIONATION CASCADE
Hgb A2: 2.6 % (ref 1.8–3.2)
Hgb A: 97.4 % (ref 96.4–98.8)
Hgb F: 0 % (ref 0.0–2.0)
Hgb S: 0 %

## 2019-10-13 LAB — ALPHA-THALASSEMIA GENOTYPR

## 2019-10-14 ENCOUNTER — Telehealth: Payer: Self-pay | Admitting: *Deleted

## 2019-10-14 NOTE — Telephone Encounter (Signed)
-----   Message from Eliezer Bottom, NP sent at 10/14/2019 12:37 PM EDT ----- Numbers improving. Continue oral iron supplement! We will see her again in 3 months. I sent Tonya a scheduling message.    ----- Message ----- From: Buel Ream, Lab [facility] [place] Sent: 09/28/2019  11:03 AM EDT To: Eliezer Bottom, NP

## 2019-10-14 NOTE — Telephone Encounter (Signed)
As noted below by Laverna Peace, NP, I left a message informing the patient about lab results. Also, she needs to continue to take the oral iron supplement. Scheduling will call you and we will see you back in three months. Instructed her to call if she had any questions or concerns.

## 2020-01-19 DIAGNOSIS — E079 Disorder of thyroid, unspecified: Secondary | ICD-10-CM | POA: Diagnosis not present

## 2020-01-19 DIAGNOSIS — E890 Postprocedural hypothyroidism: Secondary | ICD-10-CM | POA: Diagnosis not present

## 2020-01-19 DIAGNOSIS — E0500 Thyrotoxicosis with diffuse goiter without thyrotoxic crisis or storm: Secondary | ICD-10-CM | POA: Diagnosis not present

## 2020-01-19 DIAGNOSIS — E041 Nontoxic single thyroid nodule: Secondary | ICD-10-CM | POA: Diagnosis not present

## 2020-01-19 DIAGNOSIS — E042 Nontoxic multinodular goiter: Secondary | ICD-10-CM | POA: Diagnosis not present

## 2020-01-19 DIAGNOSIS — L608 Other nail disorders: Secondary | ICD-10-CM | POA: Diagnosis not present

## 2020-01-19 DIAGNOSIS — L609 Nail disorder, unspecified: Secondary | ICD-10-CM | POA: Diagnosis not present

## 2020-01-19 DIAGNOSIS — E559 Vitamin D deficiency, unspecified: Secondary | ICD-10-CM | POA: Diagnosis not present

## 2020-01-19 DIAGNOSIS — E0520 Thyrotoxicosis with toxic multinodular goiter without thyrotoxic crisis or storm: Secondary | ICD-10-CM | POA: Diagnosis not present

## 2020-01-19 DIAGNOSIS — N912 Amenorrhea, unspecified: Secondary | ICD-10-CM | POA: Diagnosis not present

## 2020-01-26 DIAGNOSIS — K30 Functional dyspepsia: Secondary | ICD-10-CM | POA: Diagnosis not present

## 2020-01-26 DIAGNOSIS — D860 Sarcoidosis of lung: Secondary | ICD-10-CM | POA: Diagnosis not present

## 2020-01-26 DIAGNOSIS — I10 Essential (primary) hypertension: Secondary | ICD-10-CM | POA: Diagnosis not present

## 2020-01-26 DIAGNOSIS — E0590 Thyrotoxicosis, unspecified without thyrotoxic crisis or storm: Secondary | ICD-10-CM | POA: Diagnosis not present

## 2020-01-26 DIAGNOSIS — D509 Iron deficiency anemia, unspecified: Secondary | ICD-10-CM | POA: Diagnosis not present

## 2020-01-26 DIAGNOSIS — E559 Vitamin D deficiency, unspecified: Secondary | ICD-10-CM | POA: Diagnosis not present

## 2020-01-26 DIAGNOSIS — J302 Other seasonal allergic rhinitis: Secondary | ICD-10-CM | POA: Diagnosis not present

## 2020-02-13 DIAGNOSIS — M8589 Other specified disorders of bone density and structure, multiple sites: Secondary | ICD-10-CM | POA: Diagnosis not present

## 2020-02-13 DIAGNOSIS — Z780 Asymptomatic menopausal state: Secondary | ICD-10-CM | POA: Diagnosis not present

## 2020-02-13 DIAGNOSIS — E041 Nontoxic single thyroid nodule: Secondary | ICD-10-CM | POA: Diagnosis not present

## 2020-02-28 ENCOUNTER — Inpatient Hospital Stay: Payer: Medicare Other | Admitting: Family

## 2020-02-28 ENCOUNTER — Inpatient Hospital Stay: Payer: Medicare Other | Attending: Hematology & Oncology

## 2020-04-26 DIAGNOSIS — E0500 Thyrotoxicosis with diffuse goiter without thyrotoxic crisis or storm: Secondary | ICD-10-CM | POA: Diagnosis not present

## 2020-04-26 DIAGNOSIS — Z886 Allergy status to analgesic agent status: Secondary | ICD-10-CM | POA: Diagnosis not present

## 2020-04-26 DIAGNOSIS — M8580 Other specified disorders of bone density and structure, unspecified site: Secondary | ICD-10-CM | POA: Diagnosis not present

## 2020-04-26 DIAGNOSIS — E041 Nontoxic single thyroid nodule: Secondary | ICD-10-CM | POA: Diagnosis not present

## 2020-04-26 DIAGNOSIS — E559 Vitamin D deficiency, unspecified: Secondary | ICD-10-CM | POA: Diagnosis not present

## 2020-04-26 DIAGNOSIS — M542 Cervicalgia: Secondary | ICD-10-CM | POA: Diagnosis not present

## 2020-04-26 DIAGNOSIS — Z881 Allergy status to other antibiotic agents status: Secondary | ICD-10-CM | POA: Diagnosis not present

## 2020-04-26 DIAGNOSIS — Z9089 Acquired absence of other organs: Secondary | ICD-10-CM | POA: Diagnosis not present

## 2020-04-26 DIAGNOSIS — E042 Nontoxic multinodular goiter: Secondary | ICD-10-CM | POA: Diagnosis not present

## 2020-04-26 DIAGNOSIS — Z888 Allergy status to other drugs, medicaments and biological substances status: Secondary | ICD-10-CM | POA: Diagnosis not present

## 2020-05-16 DIAGNOSIS — N951 Menopausal and female climacteric states: Secondary | ICD-10-CM | POA: Diagnosis not present

## 2020-05-16 DIAGNOSIS — N898 Other specified noninflammatory disorders of vagina: Secondary | ICD-10-CM | POA: Diagnosis not present

## 2020-05-16 DIAGNOSIS — N760 Acute vaginitis: Secondary | ICD-10-CM | POA: Diagnosis not present

## 2020-05-16 DIAGNOSIS — Z01419 Encounter for gynecological examination (general) (routine) without abnormal findings: Secondary | ICD-10-CM | POA: Diagnosis not present

## 2020-05-16 DIAGNOSIS — Z681 Body mass index (BMI) 19 or less, adult: Secondary | ICD-10-CM | POA: Diagnosis not present

## 2020-05-17 DIAGNOSIS — Z1231 Encounter for screening mammogram for malignant neoplasm of breast: Secondary | ICD-10-CM | POA: Diagnosis not present

## 2020-06-05 DIAGNOSIS — I10 Essential (primary) hypertension: Secondary | ICD-10-CM | POA: Diagnosis not present

## 2020-06-05 DIAGNOSIS — Z131 Encounter for screening for diabetes mellitus: Secondary | ICD-10-CM | POA: Diagnosis not present

## 2020-06-05 DIAGNOSIS — E559 Vitamin D deficiency, unspecified: Secondary | ICD-10-CM | POA: Diagnosis not present

## 2020-06-05 DIAGNOSIS — J302 Other seasonal allergic rhinitis: Secondary | ICD-10-CM | POA: Diagnosis not present

## 2020-06-05 DIAGNOSIS — E0590 Thyrotoxicosis, unspecified without thyrotoxic crisis or storm: Secondary | ICD-10-CM | POA: Diagnosis not present

## 2020-06-05 DIAGNOSIS — D860 Sarcoidosis of lung: Secondary | ICD-10-CM | POA: Diagnosis not present

## 2020-06-27 ENCOUNTER — Other Ambulatory Visit: Payer: Self-pay | Admitting: Obstetrics and Gynecology

## 2020-06-27 DIAGNOSIS — N644 Mastodynia: Secondary | ICD-10-CM

## 2020-06-27 DIAGNOSIS — N63 Unspecified lump in unspecified breast: Secondary | ICD-10-CM

## 2020-07-05 DIAGNOSIS — R0789 Other chest pain: Secondary | ICD-10-CM | POA: Diagnosis not present

## 2020-07-05 DIAGNOSIS — K30 Functional dyspepsia: Secondary | ICD-10-CM | POA: Diagnosis not present

## 2020-07-05 DIAGNOSIS — D860 Sarcoidosis of lung: Secondary | ICD-10-CM | POA: Diagnosis not present

## 2020-07-05 DIAGNOSIS — I10 Essential (primary) hypertension: Secondary | ICD-10-CM | POA: Diagnosis not present

## 2020-07-05 DIAGNOSIS — E0590 Thyrotoxicosis, unspecified without thyrotoxic crisis or storm: Secondary | ICD-10-CM | POA: Diagnosis not present

## 2020-07-05 DIAGNOSIS — E559 Vitamin D deficiency, unspecified: Secondary | ICD-10-CM | POA: Diagnosis not present

## 2020-07-05 DIAGNOSIS — J302 Other seasonal allergic rhinitis: Secondary | ICD-10-CM | POA: Diagnosis not present

## 2020-07-05 DIAGNOSIS — Z0001 Encounter for general adult medical examination with abnormal findings: Secondary | ICD-10-CM | POA: Diagnosis not present

## 2020-07-05 DIAGNOSIS — D509 Iron deficiency anemia, unspecified: Secondary | ICD-10-CM | POA: Diagnosis not present

## 2020-08-09 DIAGNOSIS — N6002 Solitary cyst of left breast: Secondary | ICD-10-CM | POA: Diagnosis not present

## 2020-08-09 DIAGNOSIS — R922 Inconclusive mammogram: Secondary | ICD-10-CM | POA: Diagnosis not present

## 2020-08-09 DIAGNOSIS — N6320 Unspecified lump in the left breast, unspecified quadrant: Secondary | ICD-10-CM | POA: Diagnosis not present

## 2020-11-06 DIAGNOSIS — E0590 Thyrotoxicosis, unspecified without thyrotoxic crisis or storm: Secondary | ICD-10-CM | POA: Diagnosis not present

## 2020-11-06 DIAGNOSIS — I10 Essential (primary) hypertension: Secondary | ICD-10-CM | POA: Diagnosis not present

## 2020-11-06 DIAGNOSIS — K30 Functional dyspepsia: Secondary | ICD-10-CM | POA: Diagnosis not present

## 2020-11-06 DIAGNOSIS — H9202 Otalgia, left ear: Secondary | ICD-10-CM | POA: Diagnosis not present

## 2020-11-06 DIAGNOSIS — D860 Sarcoidosis of lung: Secondary | ICD-10-CM | POA: Diagnosis not present

## 2020-11-06 DIAGNOSIS — J302 Other seasonal allergic rhinitis: Secondary | ICD-10-CM | POA: Diagnosis not present

## 2020-11-06 DIAGNOSIS — E559 Vitamin D deficiency, unspecified: Secondary | ICD-10-CM | POA: Diagnosis not present

## 2020-11-06 DIAGNOSIS — D509 Iron deficiency anemia, unspecified: Secondary | ICD-10-CM | POA: Diagnosis not present

## 2020-12-26 DIAGNOSIS — I10 Essential (primary) hypertension: Secondary | ICD-10-CM | POA: Diagnosis not present

## 2020-12-26 DIAGNOSIS — K029 Dental caries, unspecified: Secondary | ICD-10-CM | POA: Diagnosis not present

## 2021-03-19 DIAGNOSIS — D509 Iron deficiency anemia, unspecified: Secondary | ICD-10-CM | POA: Diagnosis not present

## 2021-03-19 DIAGNOSIS — E559 Vitamin D deficiency, unspecified: Secondary | ICD-10-CM | POA: Diagnosis not present

## 2021-03-19 DIAGNOSIS — K30 Functional dyspepsia: Secondary | ICD-10-CM | POA: Diagnosis not present

## 2021-03-19 DIAGNOSIS — D860 Sarcoidosis of lung: Secondary | ICD-10-CM | POA: Diagnosis not present

## 2021-03-19 DIAGNOSIS — J302 Other seasonal allergic rhinitis: Secondary | ICD-10-CM | POA: Diagnosis not present

## 2021-03-19 DIAGNOSIS — E0590 Thyrotoxicosis, unspecified without thyrotoxic crisis or storm: Secondary | ICD-10-CM | POA: Diagnosis not present

## 2021-03-19 DIAGNOSIS — I10 Essential (primary) hypertension: Secondary | ICD-10-CM | POA: Diagnosis not present

## 2021-03-21 DIAGNOSIS — N6012 Diffuse cystic mastopathy of left breast: Secondary | ICD-10-CM | POA: Diagnosis not present

## 2021-03-21 DIAGNOSIS — N6002 Solitary cyst of left breast: Secondary | ICD-10-CM | POA: Diagnosis not present

## 2021-03-21 DIAGNOSIS — N6324 Unspecified lump in the left breast, lower inner quadrant: Secondary | ICD-10-CM | POA: Diagnosis not present

## 2021-07-29 DIAGNOSIS — D509 Iron deficiency anemia, unspecified: Secondary | ICD-10-CM | POA: Diagnosis not present

## 2021-07-29 DIAGNOSIS — E559 Vitamin D deficiency, unspecified: Secondary | ICD-10-CM | POA: Diagnosis not present

## 2021-07-29 DIAGNOSIS — Z0001 Encounter for general adult medical examination with abnormal findings: Secondary | ICD-10-CM | POA: Diagnosis not present

## 2021-07-29 DIAGNOSIS — R634 Abnormal weight loss: Secondary | ICD-10-CM | POA: Diagnosis not present

## 2021-07-29 DIAGNOSIS — D860 Sarcoidosis of lung: Secondary | ICD-10-CM | POA: Diagnosis not present

## 2021-07-29 DIAGNOSIS — I10 Essential (primary) hypertension: Secondary | ICD-10-CM | POA: Diagnosis not present

## 2021-07-29 DIAGNOSIS — E0590 Thyrotoxicosis, unspecified without thyrotoxic crisis or storm: Secondary | ICD-10-CM | POA: Diagnosis not present

## 2021-07-29 DIAGNOSIS — K30 Functional dyspepsia: Secondary | ICD-10-CM | POA: Diagnosis not present

## 2021-07-29 DIAGNOSIS — J302 Other seasonal allergic rhinitis: Secondary | ICD-10-CM | POA: Diagnosis not present

## 2021-09-23 DIAGNOSIS — R059 Cough, unspecified: Secondary | ICD-10-CM | POA: Diagnosis not present

## 2021-09-23 DIAGNOSIS — R0602 Shortness of breath: Secondary | ICD-10-CM | POA: Diagnosis not present

## 2021-10-01 DIAGNOSIS — H05242 Constant exophthalmos, left eye: Secondary | ICD-10-CM | POA: Diagnosis not present

## 2021-10-01 DIAGNOSIS — H524 Presbyopia: Secondary | ICD-10-CM | POA: Diagnosis not present

## 2021-10-01 DIAGNOSIS — Z135 Encounter for screening for eye and ear disorders: Secondary | ICD-10-CM | POA: Diagnosis not present

## 2021-10-18 DIAGNOSIS — E0500 Thyrotoxicosis with diffuse goiter without thyrotoxic crisis or storm: Secondary | ICD-10-CM | POA: Diagnosis not present

## 2021-10-18 DIAGNOSIS — M85859 Other specified disorders of bone density and structure, unspecified thigh: Secondary | ICD-10-CM | POA: Diagnosis not present

## 2021-10-18 DIAGNOSIS — M8580 Other specified disorders of bone density and structure, unspecified site: Secondary | ICD-10-CM | POA: Diagnosis not present

## 2021-10-18 DIAGNOSIS — E041 Nontoxic single thyroid nodule: Secondary | ICD-10-CM | POA: Diagnosis not present

## 2021-10-18 DIAGNOSIS — E042 Nontoxic multinodular goiter: Secondary | ICD-10-CM | POA: Diagnosis not present

## 2021-10-18 DIAGNOSIS — E559 Vitamin D deficiency, unspecified: Secondary | ICD-10-CM | POA: Diagnosis not present

## 2021-10-18 DIAGNOSIS — E28310 Symptomatic premature menopause: Secondary | ICD-10-CM | POA: Diagnosis not present

## 2021-10-24 DIAGNOSIS — R0789 Other chest pain: Secondary | ICD-10-CM | POA: Diagnosis not present

## 2021-10-24 DIAGNOSIS — R0602 Shortness of breath: Secondary | ICD-10-CM | POA: Diagnosis not present

## 2021-10-24 DIAGNOSIS — R918 Other nonspecific abnormal finding of lung field: Secondary | ICD-10-CM | POA: Diagnosis not present

## 2021-10-31 DIAGNOSIS — R918 Other nonspecific abnormal finding of lung field: Secondary | ICD-10-CM | POA: Diagnosis not present

## 2021-10-31 DIAGNOSIS — R0602 Shortness of breath: Secondary | ICD-10-CM | POA: Diagnosis not present

## 2021-10-31 DIAGNOSIS — Z862 Personal history of diseases of the blood and blood-forming organs and certain disorders involving the immune mechanism: Secondary | ICD-10-CM | POA: Diagnosis not present

## 2021-10-31 DIAGNOSIS — R0789 Other chest pain: Secondary | ICD-10-CM | POA: Diagnosis not present

## 2021-12-04 DIAGNOSIS — I10 Essential (primary) hypertension: Secondary | ICD-10-CM | POA: Diagnosis not present

## 2021-12-04 DIAGNOSIS — K047 Periapical abscess without sinus: Secondary | ICD-10-CM | POA: Diagnosis not present

## 2021-12-04 DIAGNOSIS — E0590 Thyrotoxicosis, unspecified without thyrotoxic crisis or storm: Secondary | ICD-10-CM | POA: Diagnosis not present

## 2021-12-04 DIAGNOSIS — D509 Iron deficiency anemia, unspecified: Secondary | ICD-10-CM | POA: Diagnosis not present

## 2021-12-04 DIAGNOSIS — J302 Other seasonal allergic rhinitis: Secondary | ICD-10-CM | POA: Diagnosis not present

## 2021-12-04 DIAGNOSIS — K30 Functional dyspepsia: Secondary | ICD-10-CM | POA: Diagnosis not present

## 2021-12-04 DIAGNOSIS — E559 Vitamin D deficiency, unspecified: Secondary | ICD-10-CM | POA: Diagnosis not present

## 2021-12-04 DIAGNOSIS — D860 Sarcoidosis of lung: Secondary | ICD-10-CM | POA: Diagnosis not present

## 2021-12-12 DIAGNOSIS — D860 Sarcoidosis of lung: Secondary | ICD-10-CM | POA: Diagnosis not present

## 2021-12-12 DIAGNOSIS — R079 Chest pain, unspecified: Secondary | ICD-10-CM | POA: Diagnosis not present

## 2021-12-12 DIAGNOSIS — R0600 Dyspnea, unspecified: Secondary | ICD-10-CM | POA: Diagnosis not present

## 2021-12-12 DIAGNOSIS — I10 Essential (primary) hypertension: Secondary | ICD-10-CM | POA: Diagnosis not present

## 2021-12-24 DIAGNOSIS — T63461A Toxic effect of venom of wasps, accidental (unintentional), initial encounter: Secondary | ICD-10-CM | POA: Diagnosis not present

## 2021-12-24 DIAGNOSIS — R2241 Localized swelling, mass and lump, right lower limb: Secondary | ICD-10-CM | POA: Diagnosis not present

## 2022-03-13 DIAGNOSIS — E559 Vitamin D deficiency, unspecified: Secondary | ICD-10-CM | POA: Diagnosis not present

## 2022-03-13 DIAGNOSIS — J302 Other seasonal allergic rhinitis: Secondary | ICD-10-CM | POA: Diagnosis not present

## 2022-03-13 DIAGNOSIS — D509 Iron deficiency anemia, unspecified: Secondary | ICD-10-CM | POA: Diagnosis not present

## 2022-03-13 DIAGNOSIS — D860 Sarcoidosis of lung: Secondary | ICD-10-CM | POA: Diagnosis not present

## 2022-03-13 DIAGNOSIS — E0590 Thyrotoxicosis, unspecified without thyrotoxic crisis or storm: Secondary | ICD-10-CM | POA: Diagnosis not present

## 2022-03-13 DIAGNOSIS — I10 Essential (primary) hypertension: Secondary | ICD-10-CM | POA: Diagnosis not present

## 2022-03-13 DIAGNOSIS — K30 Functional dyspepsia: Secondary | ICD-10-CM | POA: Diagnosis not present

## 2022-07-31 DIAGNOSIS — E559 Vitamin D deficiency, unspecified: Secondary | ICD-10-CM | POA: Diagnosis not present

## 2022-07-31 DIAGNOSIS — Z0000 Encounter for general adult medical examination without abnormal findings: Secondary | ICD-10-CM | POA: Diagnosis not present

## 2022-07-31 DIAGNOSIS — Z0001 Encounter for general adult medical examination with abnormal findings: Secondary | ICD-10-CM | POA: Diagnosis not present

## 2022-07-31 DIAGNOSIS — I10 Essential (primary) hypertension: Secondary | ICD-10-CM | POA: Diagnosis not present

## 2022-07-31 DIAGNOSIS — D860 Sarcoidosis of lung: Secondary | ICD-10-CM | POA: Diagnosis not present

## 2022-07-31 DIAGNOSIS — J45909 Unspecified asthma, uncomplicated: Secondary | ICD-10-CM | POA: Diagnosis not present

## 2022-07-31 DIAGNOSIS — J302 Other seasonal allergic rhinitis: Secondary | ICD-10-CM | POA: Diagnosis not present

## 2022-07-31 DIAGNOSIS — D509 Iron deficiency anemia, unspecified: Secondary | ICD-10-CM | POA: Diagnosis not present

## 2022-07-31 DIAGNOSIS — E538 Deficiency of other specified B group vitamins: Secondary | ICD-10-CM | POA: Diagnosis not present

## 2022-07-31 DIAGNOSIS — E0590 Thyrotoxicosis, unspecified without thyrotoxic crisis or storm: Secondary | ICD-10-CM | POA: Diagnosis not present

## 2022-07-31 DIAGNOSIS — K30 Functional dyspepsia: Secondary | ICD-10-CM | POA: Diagnosis not present

## 2022-09-02 DIAGNOSIS — R079 Chest pain, unspecified: Secondary | ICD-10-CM | POA: Diagnosis not present

## 2022-09-02 DIAGNOSIS — I10 Essential (primary) hypertension: Secondary | ICD-10-CM | POA: Diagnosis not present

## 2022-09-09 DIAGNOSIS — E041 Nontoxic single thyroid nodule: Secondary | ICD-10-CM | POA: Diagnosis not present

## 2022-09-09 DIAGNOSIS — M8580 Other specified disorders of bone density and structure, unspecified site: Secondary | ICD-10-CM | POA: Diagnosis not present

## 2022-09-09 DIAGNOSIS — Z780 Asymptomatic menopausal state: Secondary | ICD-10-CM | POA: Diagnosis not present

## 2022-09-09 DIAGNOSIS — E559 Vitamin D deficiency, unspecified: Secondary | ICD-10-CM | POA: Diagnosis not present

## 2022-09-09 DIAGNOSIS — E0500 Thyrotoxicosis with diffuse goiter without thyrotoxic crisis or storm: Secondary | ICD-10-CM | POA: Diagnosis not present

## 2022-09-15 DIAGNOSIS — B9689 Other specified bacterial agents as the cause of diseases classified elsewhere: Secondary | ICD-10-CM | POA: Diagnosis not present

## 2022-09-15 DIAGNOSIS — K0889 Other specified disorders of teeth and supporting structures: Secondary | ICD-10-CM | POA: Diagnosis not present

## 2022-09-15 DIAGNOSIS — J019 Acute sinusitis, unspecified: Secondary | ICD-10-CM | POA: Diagnosis not present

## 2022-11-03 DIAGNOSIS — R051 Acute cough: Secondary | ICD-10-CM | POA: Diagnosis not present

## 2022-11-03 DIAGNOSIS — I10 Essential (primary) hypertension: Secondary | ICD-10-CM | POA: Diagnosis not present

## 2022-11-03 DIAGNOSIS — D860 Sarcoidosis of lung: Secondary | ICD-10-CM | POA: Diagnosis not present

## 2022-11-03 DIAGNOSIS — K30 Functional dyspepsia: Secondary | ICD-10-CM | POA: Diagnosis not present

## 2022-11-03 DIAGNOSIS — E0590 Thyrotoxicosis, unspecified without thyrotoxic crisis or storm: Secondary | ICD-10-CM | POA: Diagnosis not present

## 2022-11-03 DIAGNOSIS — E538 Deficiency of other specified B group vitamins: Secondary | ICD-10-CM | POA: Diagnosis not present

## 2022-11-03 DIAGNOSIS — E559 Vitamin D deficiency, unspecified: Secondary | ICD-10-CM | POA: Diagnosis not present

## 2022-11-03 DIAGNOSIS — J302 Other seasonal allergic rhinitis: Secondary | ICD-10-CM | POA: Diagnosis not present

## 2022-11-03 DIAGNOSIS — J449 Chronic obstructive pulmonary disease, unspecified: Secondary | ICD-10-CM | POA: Diagnosis not present

## 2022-11-03 DIAGNOSIS — D509 Iron deficiency anemia, unspecified: Secondary | ICD-10-CM | POA: Diagnosis not present

## 2023-02-11 DIAGNOSIS — E0590 Thyrotoxicosis, unspecified without thyrotoxic crisis or storm: Secondary | ICD-10-CM | POA: Diagnosis not present

## 2023-02-11 DIAGNOSIS — K30 Functional dyspepsia: Secondary | ICD-10-CM | POA: Diagnosis not present

## 2023-02-11 DIAGNOSIS — D860 Sarcoidosis of lung: Secondary | ICD-10-CM | POA: Diagnosis not present

## 2023-02-11 DIAGNOSIS — R3915 Urgency of urination: Secondary | ICD-10-CM | POA: Diagnosis not present

## 2023-02-11 DIAGNOSIS — D509 Iron deficiency anemia, unspecified: Secondary | ICD-10-CM | POA: Diagnosis not present

## 2023-02-11 DIAGNOSIS — E559 Vitamin D deficiency, unspecified: Secondary | ICD-10-CM | POA: Diagnosis not present

## 2023-02-11 DIAGNOSIS — J449 Chronic obstructive pulmonary disease, unspecified: Secondary | ICD-10-CM | POA: Diagnosis not present

## 2023-02-11 DIAGNOSIS — I10 Essential (primary) hypertension: Secondary | ICD-10-CM | POA: Diagnosis not present

## 2023-02-11 DIAGNOSIS — J302 Other seasonal allergic rhinitis: Secondary | ICD-10-CM | POA: Diagnosis not present

## 2023-02-11 DIAGNOSIS — L723 Sebaceous cyst: Secondary | ICD-10-CM | POA: Diagnosis not present

## 2023-02-11 DIAGNOSIS — E538 Deficiency of other specified B group vitamins: Secondary | ICD-10-CM | POA: Diagnosis not present

## 2023-04-14 DIAGNOSIS — E559 Vitamin D deficiency, unspecified: Secondary | ICD-10-CM | POA: Diagnosis not present

## 2023-04-14 DIAGNOSIS — D860 Sarcoidosis of lung: Secondary | ICD-10-CM | POA: Diagnosis not present

## 2023-04-14 DIAGNOSIS — I10 Essential (primary) hypertension: Secondary | ICD-10-CM | POA: Diagnosis not present

## 2023-04-14 DIAGNOSIS — D509 Iron deficiency anemia, unspecified: Secondary | ICD-10-CM | POA: Diagnosis not present

## 2023-04-14 DIAGNOSIS — E538 Deficiency of other specified B group vitamins: Secondary | ICD-10-CM | POA: Diagnosis not present

## 2023-04-14 DIAGNOSIS — J449 Chronic obstructive pulmonary disease, unspecified: Secondary | ICD-10-CM | POA: Diagnosis not present

## 2023-04-14 DIAGNOSIS — K30 Functional dyspepsia: Secondary | ICD-10-CM | POA: Diagnosis not present

## 2023-04-14 DIAGNOSIS — E0590 Thyrotoxicosis, unspecified without thyrotoxic crisis or storm: Secondary | ICD-10-CM | POA: Diagnosis not present

## 2023-04-14 DIAGNOSIS — J302 Other seasonal allergic rhinitis: Secondary | ICD-10-CM | POA: Diagnosis not present

## 2023-05-06 DIAGNOSIS — E538 Deficiency of other specified B group vitamins: Secondary | ICD-10-CM | POA: Diagnosis not present

## 2023-05-06 DIAGNOSIS — D509 Iron deficiency anemia, unspecified: Secondary | ICD-10-CM | POA: Diagnosis not present

## 2023-05-06 DIAGNOSIS — I10 Essential (primary) hypertension: Secondary | ICD-10-CM | POA: Diagnosis not present

## 2023-05-06 DIAGNOSIS — D860 Sarcoidosis of lung: Secondary | ICD-10-CM | POA: Diagnosis not present

## 2023-05-06 DIAGNOSIS — J449 Chronic obstructive pulmonary disease, unspecified: Secondary | ICD-10-CM | POA: Diagnosis not present

## 2023-05-06 DIAGNOSIS — E0590 Thyrotoxicosis, unspecified without thyrotoxic crisis or storm: Secondary | ICD-10-CM | POA: Diagnosis not present

## 2023-05-06 DIAGNOSIS — E559 Vitamin D deficiency, unspecified: Secondary | ICD-10-CM | POA: Diagnosis not present

## 2023-05-06 DIAGNOSIS — K30 Functional dyspepsia: Secondary | ICD-10-CM | POA: Diagnosis not present

## 2023-05-06 DIAGNOSIS — E785 Hyperlipidemia, unspecified: Secondary | ICD-10-CM | POA: Diagnosis not present

## 2023-05-06 DIAGNOSIS — J302 Other seasonal allergic rhinitis: Secondary | ICD-10-CM | POA: Diagnosis not present

## 2023-09-04 DIAGNOSIS — S62307D Unspecified fracture of fifth metacarpal bone, left hand, subsequent encounter for fracture with routine healing: Secondary | ICD-10-CM | POA: Diagnosis not present

## 2023-09-04 DIAGNOSIS — S62307A Unspecified fracture of fifth metacarpal bone, left hand, initial encounter for closed fracture: Secondary | ICD-10-CM | POA: Diagnosis not present

## 2023-10-13 ENCOUNTER — Other Ambulatory Visit: Payer: Self-pay | Admitting: Physician Assistant

## 2023-10-13 DIAGNOSIS — Z131 Encounter for screening for diabetes mellitus: Secondary | ICD-10-CM | POA: Diagnosis not present

## 2023-10-13 DIAGNOSIS — Z0001 Encounter for general adult medical examination with abnormal findings: Secondary | ICD-10-CM | POA: Diagnosis not present

## 2023-10-13 DIAGNOSIS — E559 Vitamin D deficiency, unspecified: Secondary | ICD-10-CM | POA: Diagnosis not present

## 2023-10-13 DIAGNOSIS — K30 Functional dyspepsia: Secondary | ICD-10-CM | POA: Diagnosis not present

## 2023-10-13 DIAGNOSIS — J302 Other seasonal allergic rhinitis: Secondary | ICD-10-CM | POA: Diagnosis not present

## 2023-10-13 DIAGNOSIS — Z1231 Encounter for screening mammogram for malignant neoplasm of breast: Secondary | ICD-10-CM

## 2023-10-13 DIAGNOSIS — D509 Iron deficiency anemia, unspecified: Secondary | ICD-10-CM | POA: Diagnosis not present

## 2023-10-13 DIAGNOSIS — I10 Essential (primary) hypertension: Secondary | ICD-10-CM | POA: Diagnosis not present

## 2023-10-13 DIAGNOSIS — D860 Sarcoidosis of lung: Secondary | ICD-10-CM | POA: Diagnosis not present

## 2023-10-13 DIAGNOSIS — E785 Hyperlipidemia, unspecified: Secondary | ICD-10-CM | POA: Diagnosis not present

## 2023-10-13 DIAGNOSIS — E0590 Thyrotoxicosis, unspecified without thyrotoxic crisis or storm: Secondary | ICD-10-CM | POA: Diagnosis not present

## 2023-10-27 ENCOUNTER — Telehealth: Payer: Self-pay | Admitting: Obstetrics and Gynecology

## 2023-10-27 NOTE — Telephone Encounter (Addendum)
 Called the pt to get her scheduled from an referral that we rec'd from [facility] she was not available daughter stated that she will get her to give our office a call back.

## 2024-01-14 DIAGNOSIS — Z0000 Encounter for general adult medical examination without abnormal findings: Secondary | ICD-10-CM | POA: Diagnosis not present

## 2024-01-14 DIAGNOSIS — D509 Iron deficiency anemia, unspecified: Secondary | ICD-10-CM | POA: Diagnosis not present

## 2024-01-14 DIAGNOSIS — E538 Deficiency of other specified B group vitamins: Secondary | ICD-10-CM | POA: Diagnosis not present

## 2024-01-14 DIAGNOSIS — E785 Hyperlipidemia, unspecified: Secondary | ICD-10-CM | POA: Diagnosis not present

## 2024-01-14 DIAGNOSIS — K30 Functional dyspepsia: Secondary | ICD-10-CM | POA: Diagnosis not present

## 2024-01-14 DIAGNOSIS — D860 Sarcoidosis of lung: Secondary | ICD-10-CM | POA: Diagnosis not present

## 2024-01-14 DIAGNOSIS — E559 Vitamin D deficiency, unspecified: Secondary | ICD-10-CM | POA: Diagnosis not present

## 2024-01-14 DIAGNOSIS — J302 Other seasonal allergic rhinitis: Secondary | ICD-10-CM | POA: Diagnosis not present

## 2024-01-14 DIAGNOSIS — I10 Essential (primary) hypertension: Secondary | ICD-10-CM | POA: Diagnosis not present

## 2024-01-14 DIAGNOSIS — Z131 Encounter for screening for diabetes mellitus: Secondary | ICD-10-CM | POA: Diagnosis not present

## 2024-01-14 DIAGNOSIS — E0590 Thyrotoxicosis, unspecified without thyrotoxic crisis or storm: Secondary | ICD-10-CM | POA: Diagnosis not present

## 2024-01-14 DIAGNOSIS — J449 Chronic obstructive pulmonary disease, unspecified: Secondary | ICD-10-CM | POA: Diagnosis not present

## 2024-02-02 DIAGNOSIS — K30 Functional dyspepsia: Secondary | ICD-10-CM | POA: Diagnosis not present

## 2024-02-02 DIAGNOSIS — E785 Hyperlipidemia, unspecified: Secondary | ICD-10-CM | POA: Diagnosis not present

## 2024-02-02 DIAGNOSIS — D860 Sarcoidosis of lung: Secondary | ICD-10-CM | POA: Diagnosis not present

## 2024-02-02 DIAGNOSIS — E559 Vitamin D deficiency, unspecified: Secondary | ICD-10-CM | POA: Diagnosis not present

## 2024-02-02 DIAGNOSIS — E538 Deficiency of other specified B group vitamins: Secondary | ICD-10-CM | POA: Diagnosis not present

## 2024-02-02 DIAGNOSIS — I10 Essential (primary) hypertension: Secondary | ICD-10-CM | POA: Diagnosis not present

## 2024-02-02 DIAGNOSIS — J449 Chronic obstructive pulmonary disease, unspecified: Secondary | ICD-10-CM | POA: Diagnosis not present

## 2024-02-02 DIAGNOSIS — D509 Iron deficiency anemia, unspecified: Secondary | ICD-10-CM | POA: Diagnosis not present

## 2024-02-02 DIAGNOSIS — E0590 Thyrotoxicosis, unspecified without thyrotoxic crisis or storm: Secondary | ICD-10-CM | POA: Diagnosis not present

## 2024-02-02 DIAGNOSIS — J302 Other seasonal allergic rhinitis: Secondary | ICD-10-CM | POA: Diagnosis not present

## 2024-03-14 DIAGNOSIS — Z888 Allergy status to other drugs, medicaments and biological substances status: Secondary | ICD-10-CM | POA: Diagnosis not present

## 2024-03-14 DIAGNOSIS — R09A2 Foreign body sensation, throat: Secondary | ICD-10-CM | POA: Diagnosis not present

## 2024-03-14 DIAGNOSIS — Z79899 Other long term (current) drug therapy: Secondary | ICD-10-CM | POA: Diagnosis not present
